# Patient Record
Sex: Male | Born: 1990 | Race: White | Hispanic: No | Marital: Married | Smoking: Former smoker
Health system: Southern US, Community
[De-identification: ages and names within clinical notes are randomized; demographics above are authoritative.]

## PROBLEM LIST (undated history)

## (undated) DIAGNOSIS — E785 Hyperlipidemia, unspecified: Secondary | ICD-10-CM

## (undated) DIAGNOSIS — K219 Gastro-esophageal reflux disease without esophagitis: Secondary | ICD-10-CM

## (undated) HISTORY — DX: Hyperlipidemia, unspecified: E78.5

## (undated) HISTORY — PX: OTHER SURGICAL HISTORY: SHX169

## (undated) HISTORY — DX: Gastro-esophageal reflux disease without esophagitis: K21.9

---
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
## 2008-07-14 IMAGING — CR RIGHT ANKLE - COMPLETE 3+ VIEW
1 series · 5 of 5 positions shown · non-contrast
Comparison: none

REASON FOR EXAM: swelling
COMMENTS:

[Series 1: view not recorded · 0.17mm/px · 5 of 5 slices shown]
[im 1/5]
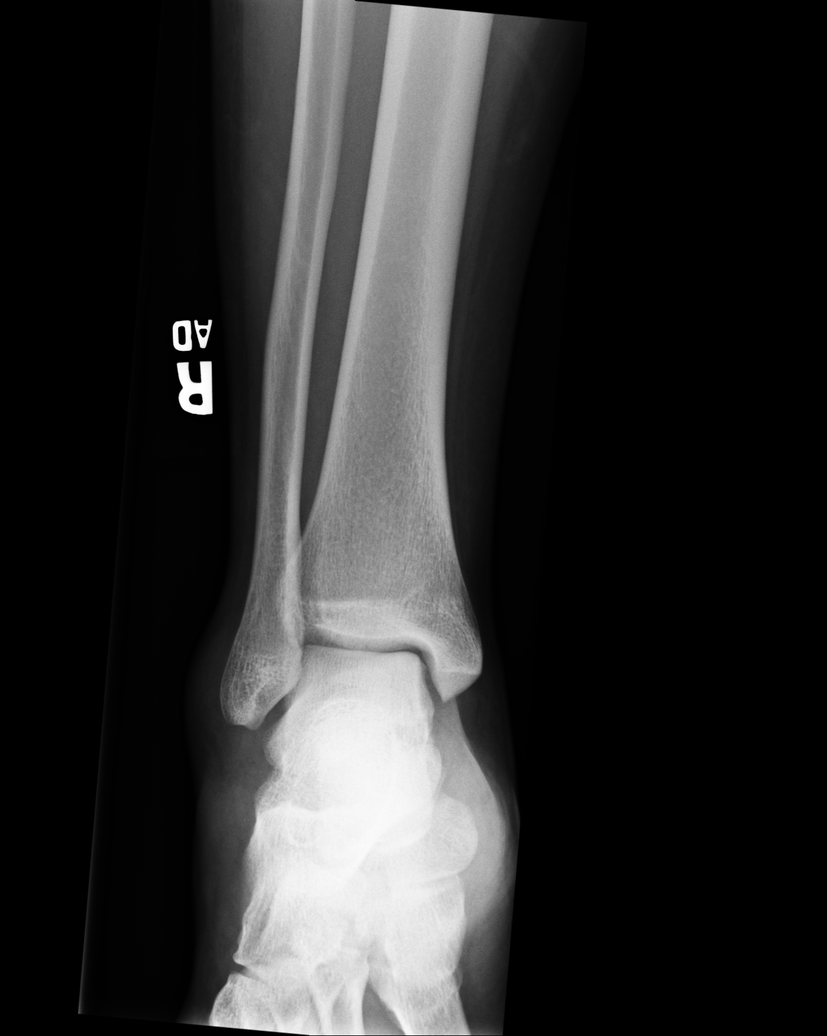
[im 2/5]
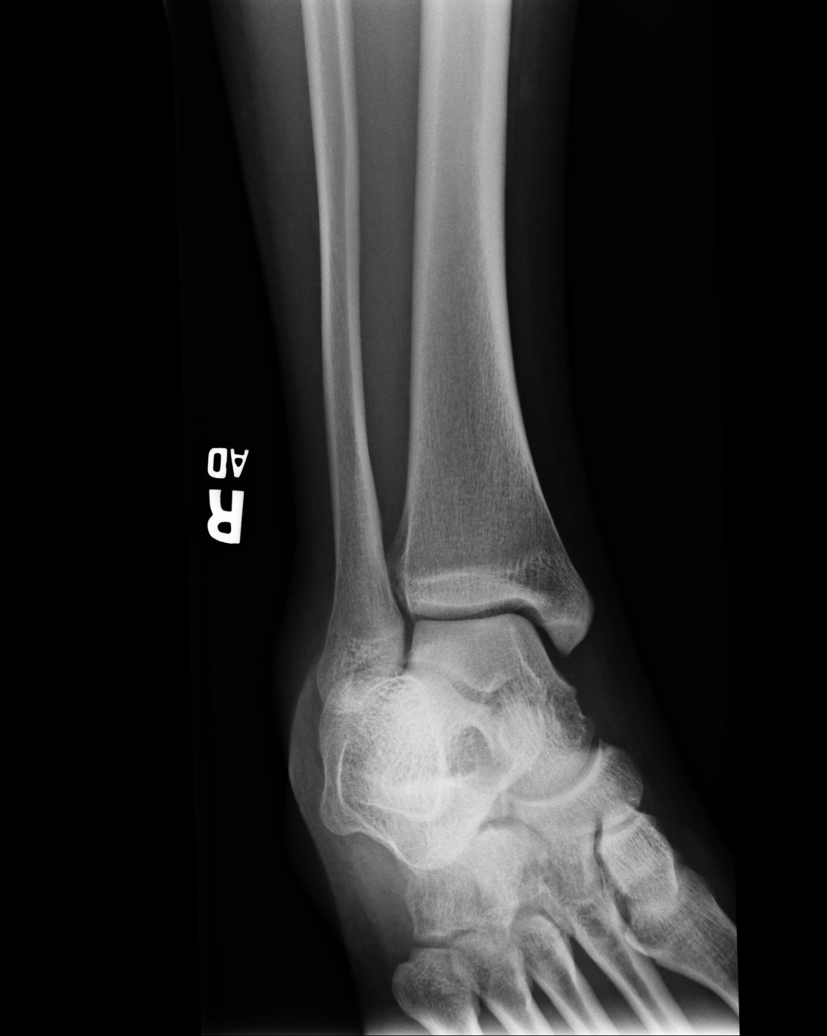
[im 3/5]
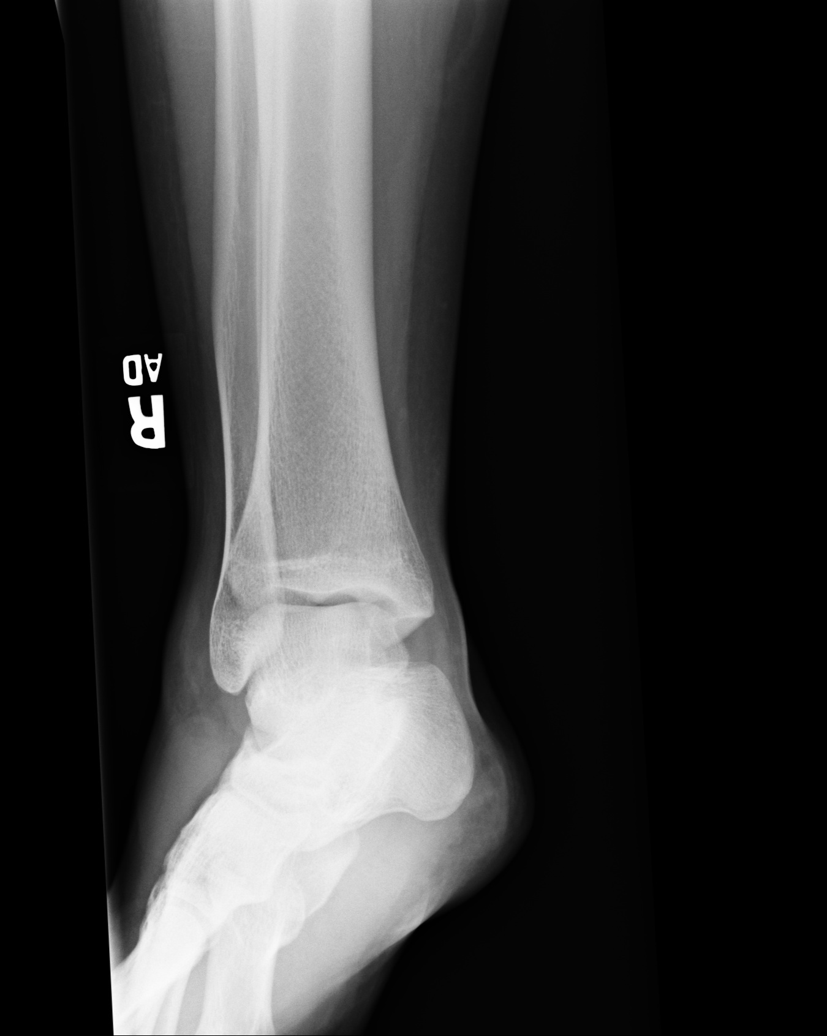
[im 4/5]
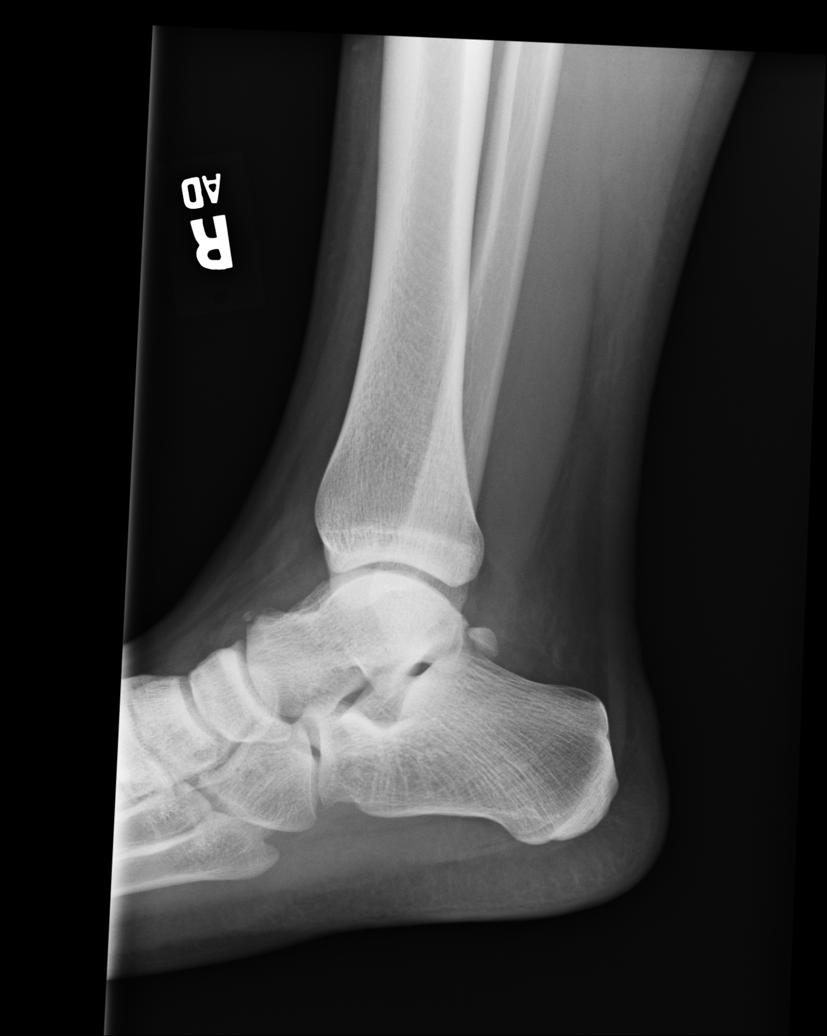
[im 5/5]
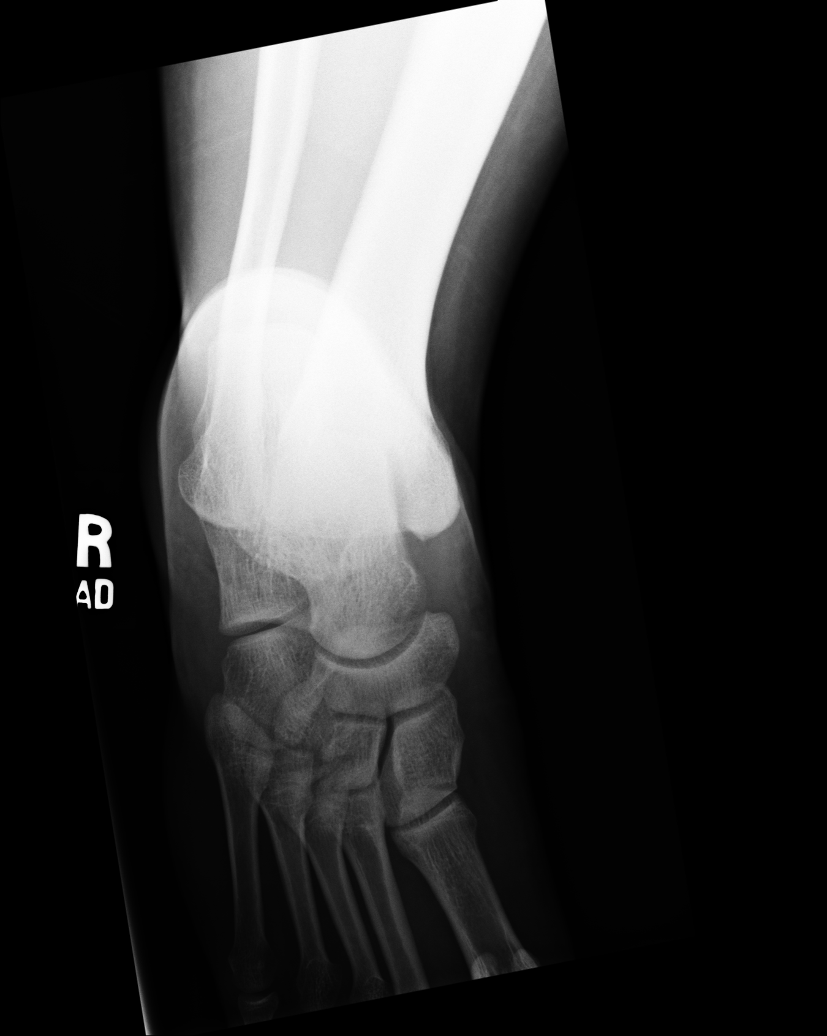

[5 of 5 positions shown; findings below may reference images not displayed]

PROCEDURE:     DXR - DXR ANKLE RIGHT COMPLETE  - October 27, 2006  [DATE]

RESULT:     There is soft tissue swelling over the lateral malleolus. The
ankle joint mortise is preserved. The talar dome is intact. The metatarsal
bases are grossly normal. There is a tiny bony density adjacent to the
distal aspect of the talus.
IMPRESSION: 1.There is soft tissue swelling over the lateral malleolus. I do not see
evidence of an acute ankle fracture. There may be a tiny avulsion from the
dorsal aspect of the distal talus. Correlation clinically here is needed.

## 2008-10-14 ENCOUNTER — Emergency Department (HOSPITAL_COMMUNITY): Admission: EM | Admit: 2008-10-14 | Discharge: 2008-10-14 | Payer: Self-pay | Admitting: Emergency Medicine

## 2010-10-20 NOTE — Op Note (Signed)
NAME:  Christian York, Christian York               ACCOUNT NO.:  [id number]   MEDICAL RECORD NO.:  [id number]          PATIENT TYPE:  AMB   LOCATION:  [facility]                         FACILITY:  [facility]   PHYSICIAN:  Jon Gills, M.D.  DATE OF BIRTH:  1990/09/21   DATE OF PROCEDURE:  07/13/2005  DATE OF DISCHARGE:  07/13/2005                                 OPERATIVE REPORT   PREOPERATIVE DIAGNOSIS:  Lower gastrointestinal bleeding.   POSTOPERATIVE DIAGNOSIS:  Lower gastrointestinal bleeding.   OPERATION:  Colonoscopy.   SURGEON:  Jon Gills, M.D.   ASSISTANT:  None.   DESCRIPTION OF FINDINGS:  Following informed written consent, the patient  was taken to the operating room and placed under general anesthesia with  continuous cardiopulmonary monitoring.  He remained in the supine position  and examination of the perineum revealed a solitary noninflamed hemorrhoid,  but no tags or fissures.  Digital examination of the rectum revealed an  empty rectal vault.  The Olympus PCF-100 colonoscope was passed per rectum  and advanced without difficulty.  The overall colon prep was poor, but I was  able to advance the colonoscope 110 cm to the hepatic flexure.  Normal  mucosa was seen throughout.  There was no evidence for polyps or vascular  abnormalities.  There was no ulceration, inflammation or granularity seen.  I could find no evidence for active or previous bleeding.  The colonoscope  was gradually withdrawn and the patient was awakened and taken to the  recovery room in satisfactory condition.  He will be released later today to  the care of his family.  I had no specific recommendations to make for  followup other than to treat his hemorrhoid if it should become symptomatic  again with over-the-counter therapy.   DESCRIPTION OF TECHNICAL PROCEDURES USED:  Olympus PCF colonoscope with cold  biopsy forceps.   DESCRIPTION OF SPECIMENS REMOVED:  None.            ______________________________  Jon Gills, M.D.     JHC/MEDQ  D:  08/20/2005  T:  08/21/2005  Job:  [id number]   cc:   Fonnie Mu, M.D.  Fax: [phone]

## 2017-07-10 ENCOUNTER — Ambulatory Visit: Payer: 59 | Admitting: Family Medicine

## 2017-07-10 ENCOUNTER — Encounter: Payer: Self-pay | Admitting: Family Medicine

## 2017-07-10 VITALS — BP 124/76 | HR 83 | Temp 98.4°F | Ht 74.5 in | Wt 346.2 lb

## 2017-07-10 DIAGNOSIS — Z6841 Body Mass Index (BMI) 40.0 and over, adult: Secondary | ICD-10-CM | POA: Diagnosis not present

## 2017-07-10 DIAGNOSIS — E785 Hyperlipidemia, unspecified: Secondary | ICD-10-CM

## 2017-07-10 DIAGNOSIS — Z7689 Persons encountering health services in other specified circumstances: Secondary | ICD-10-CM | POA: Diagnosis not present

## 2017-07-10 LAB — CBC
HCT: 44 % (ref 39.0–52.0)
Hemoglobin: 14.9 g/dL (ref 13.0–17.0)
MCHC: 33.9 g/dL (ref 30.0–36.0)
MCV: 90.5 fl (ref 78.0–100.0)
Platelets: 309 10*3/uL (ref 150.0–400.0)
RBC: 4.86 Mil/uL (ref 4.22–5.81)
RDW: 13.6 % (ref 11.5–15.5)
WBC: 6.9 10*3/uL (ref 4.0–10.5)

## 2017-07-10 LAB — HEMOGLOBIN A1C: HEMOGLOBIN A1C: 5.6 % (ref 4.6–6.5)

## 2017-07-10 NOTE — Progress Notes (Signed)
Subjective:    Patient ID: Christian York, male    DOB: 1990-08-07, 27 y.o.   MRN: [id number]  HPI This is a 27 yo male who presents today to establish care. He is married. He sells copiers.  Watches TV. Has dog- lab. No recent health care.    GERD- Takes omeprazole occasionally- symptoms with heavy foods, tomato based products. Has been watching diet, avoiding triggers, and has had fewer symptoms.   Obesity- Has been watching weight and has lost 16 pounds in several months. Walks dog daily. Some exercise walking and lifting with his job.   Hyperlipidemia- has had elevated cholesterol in past, has taken fish oil. Did not want to take statins.   Dentist- regular Eye exam- within last 2 years  Past Medical History:  Diagnosis Date  . GERD (gastroesophageal reflux disease)   . Hyperlipidemia    .History reviewed. No pertinent surgical history. Family History  Problem Relation Age of Onset  . Diabetes Mother   . Hyperlipidemia Mother   . Stroke Mother   . Diabetes Father   . Cancer Maternal Grandmother   . Cancer Maternal Grandfather   . Early death Maternal Grandfather   . COPD Paternal Grandmother    Social History   Tobacco Use  . Smoking status: Former Games developer  . Smokeless tobacco: Never Used  Substance Use Topics  . Alcohol use: Yes  . Drug use: No      Review of Systems  Constitutional: Negative for fatigue and unexpected weight change.  Respiratory: Negative for cough and shortness of breath.   Cardiovascular: Negative for chest pain, palpitations and leg swelling.  Gastrointestinal: Positive for abdominal pain (occasional reflux). Negative for constipation, diarrhea, nausea and vomiting.  Musculoskeletal: Negative for arthralgias, back pain, myalgias and neck pain.       Occasional left ankle pain. Has old fracture. Wears lace up brace.   Neurological: Negative for headaches.       Objective:   Physical Exam  Constitutional: He is oriented to person,  place, and time. He appears well-developed and well-nourished. No distress.  HENT:  Head: Normocephalic and atraumatic.  Eyes: Conjunctivae are normal.  Cardiovascular: Normal rate, regular rhythm and normal heart sounds.  Pulmonary/Chest: Effort normal and breath sounds normal.  Musculoskeletal: He exhibits no edema.  Neurological: He is alert and oriented to person, place, and time.  Skin: Skin is warm and dry. He is not diaphoretic.  Psychiatric: He has a normal mood and affect. His behavior is normal. Judgment and thought content normal.  Vitals reviewed.     BP 124/76   Pulse 83   Temp 98.4 F (36.9 C) (Oral)   Ht 6' 2.5" (1.892 m)   Wt (!) 346 lb 4 oz (157.1 kg)   SpO2 95%   BMI 43.86 kg/m  Ideal body weight: 83.4 kg (183 lb 12.1 oz) Adjusted ideal body weight: 112.8 kg (248 lb 12 oz)     Assessment & Plan:  1. Encounter to establish care - Discussed and encouraged healthy lifestyle choices- adequate sleep, regular exercise, stress management and healthy food choices.   2. Class 3 severe obesity due to excess calories without serious comorbidity with body mass index (BMI) of 40.0 to 44.9 in adult (HCC) - TSH - Hemoglobin A1c - encouraged his weight loss efforts and discussed reasonable goals  3. Hyperlipidemia, unspecified hyperlipidemia type - CBC - Comprehensive metabolic panel - TSH - Hemoglobin A1c   Olean Ree, FNP-BC  [place] [facility], [facility]  07/10/2017 5:07 PM

## 2017-07-10 NOTE — Patient Instructions (Signed)

## 2017-07-11 LAB — COMPREHENSIVE METABOLIC PANEL
ALBUMIN: 4.6 g/dL (ref 3.5–5.2)
ALK PHOS: 64 U/L (ref 39–117)
ALT: 40 U/L (ref 0–53)
AST: 24 U/L (ref 0–37)
BUN: 15 mg/dL (ref 6–23)
CO2: 29 mEq/L (ref 19–32)
Calcium: 9.6 mg/dL (ref 8.4–10.5)
Chloride: 103 mEq/L (ref 96–112)
Creatinine, Ser: 1.21 mg/dL (ref 0.40–1.50)
GFR: 75.68 mL/min (ref >60.00)
GLUCOSE: 87 mg/dL (ref 70–99)
POTASSIUM: 5.1 meq/L (ref 3.5–5.1)
Sodium: 139 mEq/L (ref 135–145)
TOTAL PROTEIN: 7.6 g/dL (ref 6.0–8.3)
Total Bilirubin: 0.5 mg/dL (ref 0.2–1.2)

## 2017-07-11 LAB — TSH: TSH: 1.92 u[IU]/mL (ref 0.35–4.50)

## 2017-11-29 ENCOUNTER — Ambulatory Visit: Payer: 59 | Admitting: Family Medicine

## 2017-11-29 ENCOUNTER — Encounter: Payer: Self-pay | Admitting: Family Medicine

## 2017-11-29 VITALS — BP 128/84 | HR 87 | Temp 98.0°F | Ht 74.5 in | Wt 349.8 lb

## 2017-11-29 DIAGNOSIS — N4889 Other specified disorders of penis: Secondary | ICD-10-CM

## 2017-11-29 DIAGNOSIS — K219 Gastro-esophageal reflux disease without esophagitis: Secondary | ICD-10-CM

## 2017-11-29 NOTE — Progress Notes (Signed)
   Subjective:    Patient ID: Christian York, male    DOB: 1990/10/02, 27 y.o.   MRN: [id number]  HPI This is a 27 yo male who presents today with penis pain x 2 days. Wife had recent vaginal yeast infection and he wonders if he has yeast. No new soaps, detergents, underwear, etc. Some increased intercourse over last couple of weeks. Poured alcohol on penis last night with significant pain. Feels better and less irritated today. No discharge, dysuria, difficulty urinating.   Has tried to stop omeprazole cold [place]. Concerned about long term side effects. Increased GERD symptoms.   [place]    Past Medical History:  Diagnosis Date  . GERD (gastroesophageal reflux disease)   . Hyperlipidemia    No past surgical history on file. Family History  Problem Relation Age of Onset  . Diabetes Mother   . Hyperlipidemia Mother   . Stroke Mother   . Diabetes Father   . Cancer Maternal Grandmother   . Cancer Maternal Grandfather   . Early death Maternal Grandfather   . COPD Paternal Grandmother    Social History   Tobacco Use  . Smoking status: Former Games developermoker  . Smokeless tobacco: Never Used  Substance Use Topics  . Alcohol use: Yes  . Drug use: No      Review of Systems Per HPI    Objective:   Physical Exam  Constitutional: He is oriented to person, place, and time. He appears well-developed and well-nourished.  HENT:  Head: Normocephalic and atraumatic.  Cardiovascular: Normal rate.  Pulmonary/Chest: Effort normal.  Genitourinary: Circumcised. Penile erythema (very mild around left side of glans. ) present. No phimosis, hypospadias or penile tenderness. No discharge found.  Neurological: He is alert and oriented to person, place, and time.  Skin: Skin is warm and dry.  Vitals reviewed.     BP 128/84 (BP Location: Right Arm, Patient Position: Sitting, Cuff Size: Large)   Pulse 87   Temp 98 F (36.7 C) (Oral)   Ht 6' 2.5" (1.892 m)   Wt  (!) 349 lb 12 oz (158.6 kg)   SpO2 97%   BMI 44.30 kg/m  Wt Readings from Last 3 Encounters:  11/29/17 (!) 349 lb 12 oz (158.6 kg)  07/10/17 (!) 346 lb 4 oz (157.1 kg)       Assessment & Plan:  1. Irritation of penis - improved today, no discharge - instructed to wash with mild soap and water, dry thoroughly - if increased redness or irritation can use OTC clotrimazole BID x 7 days - RTC precautions reviewed  2. Gastroesophageal reflux disease, esophagitis presence not specified - discussed weaning off omeprazole and provided instructions to minimize rebound effects   Olean Reeeborah Marcas Bowsher, FNP-BC  [place] [place] [facility], [facility]  11/29/2017 2:37 PM

## 2017-11-29 NOTE — Patient Instructions (Signed)
For weaning off omeprazole- take 1/2 tablet for at least 2 weeks, can take longer Your can also add either ranitidine or famotadine otc twice a day (generic Zantac or Pepcid)  Use mild soap and water daily, get area dry, if becomes more irritated or red can use Clotrimazole over the counter twice a day for 7 days. Just apply small amount.

## 2019-03-01 DIAGNOSIS — A084 Viral intestinal infection, unspecified: Secondary | ICD-10-CM | POA: Diagnosis not present

## 2019-03-01 DIAGNOSIS — J029 Acute pharyngitis, unspecified: Secondary | ICD-10-CM | POA: Diagnosis not present

## 2019-03-21 DIAGNOSIS — Z23 Encounter for immunization: Secondary | ICD-10-CM | POA: Diagnosis not present

## 2019-05-05 ENCOUNTER — Other Ambulatory Visit: Payer: Self-pay

## 2019-05-05 DIAGNOSIS — Z20822 Contact with and (suspected) exposure to covid-19: Secondary | ICD-10-CM

## 2019-05-07 LAB — NOVEL CORONAVIRUS, NAA: SARS-CoV-2, NAA: NOT DETECTED

## 2019-07-20 ENCOUNTER — Other Ambulatory Visit: Payer: Self-pay | Admitting: Family Medicine

## 2019-07-23 ENCOUNTER — Other Ambulatory Visit: Payer: 59

## 2019-07-27 ENCOUNTER — Encounter: Payer: Self-pay | Admitting: Family Medicine

## 2019-07-27 ENCOUNTER — Other Ambulatory Visit: Payer: Self-pay

## 2019-07-27 ENCOUNTER — Ambulatory Visit (INDEPENDENT_AMBULATORY_CARE_PROVIDER_SITE_OTHER): Payer: BC Managed Care – PPO | Admitting: Family Medicine

## 2019-07-27 VITALS — BP 128/84 | HR 86 | Temp 97.9°F | Ht 75.0 in | Wt 344.1 lb

## 2019-07-27 DIAGNOSIS — K219 Gastro-esophageal reflux disease without esophagitis: Secondary | ICD-10-CM

## 2019-07-27 DIAGNOSIS — Z0000 Encounter for general adult medical examination without abnormal findings: Secondary | ICD-10-CM

## 2019-07-27 LAB — HEMOGLOBIN A1C: Hgb A1c MFr Bld: 5.9 % (ref 4.6–6.5)

## 2019-07-27 MED ORDER — OMEPRAZOLE 20 MG PO CPDR: 20.0000 mg | DELAYED_RELEASE_CAPSULE | Freq: Every day | ORAL | 1 refills | Status: DC

## 2019-07-27 NOTE — Progress Notes (Signed)
Subjective:    Patient ID: Christian York, male    DOB: 1990-11-01, 29 y.o.   MRN: [id number]  HPI This is a 29 year old male who presents today for CPE. Had a baby boy March 2020. He and wife both had to get new jobs. He is managing a Production assistant, radio.    Last CPE-had labs 2 years ago Tdap-07/08/2013 Flu-03/21/2019 Dental- regular Exercise-not outside of work  Jerrye Bushy- takes omeprazole most days. Triggered by not eating, eating tomatoes, sweets, fried foods.   Obesity- eats out 2-3 meals a day.  Would like to lose weight.  His wife does not cook.  He does not mind cooking.  He likes a variety of foods.  Struggles with planning and preparation due to time constraints.  Wife reports some snoring, no apneic episodes.  He is sleeping 7 to 8 hours a night and feeling rested.  Does not fall asleep while driving.  Review of Systems  Constitutional: Negative.   HENT: Negative.   Eyes: Negative.   Respiratory: Negative.   Cardiovascular: Negative.   Gastrointestinal: Positive for abdominal pain (GERD per HPI). Negative for blood in stool, constipation and diarrhea.  Endocrine: Negative.   Genitourinary: Negative.   Musculoskeletal: Negative.   Skin: Negative.   Allergic/Immunologic: Negative.   Neurological: Negative.   Hematological: Negative.   Psychiatric/Behavioral: Negative.        Objective:   Physical Exam Vitals reviewed.  Constitutional:      General: He is not in acute distress.    Appearance: Normal appearance. He is obese. He is not ill-appearing, toxic-appearing or diaphoretic.  HENT:     Head: Normocephalic and atraumatic.  Eyes:     Conjunctiva/sclera: Conjunctivae normal.  Cardiovascular:     Rate and Rhythm: Normal rate and regular rhythm.     Heart sounds: Normal heart sounds.  Pulmonary:     Effort: Pulmonary effort is normal.     Breath sounds: Normal breath sounds.  Abdominal:     General: Abdomen is flat. Bowel sounds are normal. There is no distension.       Palpations: Abdomen is soft. There is no mass.     Tenderness: There is no abdominal tenderness. There is no guarding or rebound.     Hernia: No hernia is present.  Musculoskeletal:     Cervical back: Normal range of motion.     Right lower leg: No edema.     Left lower leg: No edema.  Skin:    General: Skin is warm and dry.  Neurological:     Mental Status: He is alert and oriented to person, place, and time.  Psychiatric:        Mood and Affect: Mood normal.        Behavior: Behavior normal.        Thought Content: Thought content normal.        Judgment: Judgment normal.       BP 128/84 (BP Location: Left Arm, Patient Position: Sitting, Cuff Size: Large)   Pulse 86   Temp 97.9 F (36.6 C) (Temporal)   Ht 6\' 3"  (1.905 m)   Wt (!) 344 lb 1.9 oz (156.1 kg)   SpO2 97%   BMI 43.01 kg/m  Wt Readings from Last 3 Encounters:  07/27/19 (!) 344 lb 1.9 oz (156.1 kg)  11/29/17 (!) 349 lb 12 oz (158.6 kg)  07/10/17 (!) 346 lb 4 oz (157.1 kg)   Depression screen [facility] 2/9 07/27/2019  Decreased Interest 0  Down, Depressed, Hopeless 0  PHQ - 2 Score 0       Assessment & Plan:  1. Annual physical exam - Discussed and encouraged healthy lifestyle choices- adequate sleep, regular exercise, stress management and healthy food choices.    2. Morbid obesity (HCC) - provided written information regarding healthy, balanced meals, online resources - Hemoglobin A1c - Lipid panel  3. Gastroesophageal reflux disease, unspecified whether esophagitis present - continue omeprazole 20 mg prn, discussed avoiding triggers, importance of weight loss   Olean Ree, FNP-BC  [place] [facility] at [facility], [facility]  07/27/2019 9:32 AM

## 2019-07-27 NOTE — Patient Instructions (Signed)
A resource that I like is www.dietdoctor.com/diabetes/diet  Youtube- Wylene Simmer, Dr. Allyson Sabal  Here are some guidelines to help you with meal planning -  Avoid all processed and packaged foods (bread, pasta, crackers, chips, etc) and beverages containing calories.  Avoid added sugars and excessive natural sugars.  Attention to how you feel if you consume artificial sweeteners.  Do they make you more hungry or raise your blood sugar?  With every meal and snack, aim to get 20 g of protein (3 ounces of meat, 4 ounces of fish, 3 eggs, protein powder, 1 cup [place] yogurt, 1 cup cottage cheese, etc.)  Increase fiber in the form of non-starchy vegetables.  These help you feel full with very little carbohydrates and are good for gut health.  Eat 1 serving healthy carb per meal- 1/2 cup brown rice, beans, potato, corn- pay attention to whether or not this significantly raises your blood sugar. If it does, reduce the frequency you consume these.   Eat 2-3 servings of lower sugar fruits daily.  This includes berries, apples, oranges, peaches, pears, one half banana.  Have small amounts of good fats such as avocado, nuts, olive oil, nut butters, olives.  Add a little cheese to your salads to make them tasty.

## 2019-07-28 LAB — LDL CHOLESTEROL, DIRECT: Direct LDL: 154 mg/dL

## 2019-07-28 LAB — LIPID PANEL
Cholesterol: 229 mg/dL — ABNORMAL HIGH (ref 0–200)
HDL: 36.4 mg/dL — ABNORMAL LOW (ref >39.00)
NonHDL: 192.37
Total CHOL/HDL Ratio: 6
Triglycerides: 247 mg/dL — ABNORMAL HIGH (ref 0.0–149.0)
VLDL: 49.4 mg/dL — ABNORMAL HIGH (ref 0.0–40.0)

## 2019-09-04 ENCOUNTER — Ambulatory Visit: Payer: BC Managed Care – PPO | Attending: Internal Medicine

## 2019-09-04 DIAGNOSIS — Z23 Encounter for immunization: Secondary | ICD-10-CM

## 2019-09-04 NOTE — Progress Notes (Signed)
   Covid-19 Vaccination Clinic  Name:  Christian York    MRN: [id number] DOB: 10-30-1990  09/04/2019  [name] was observed post Covid-19 immunization for 15 minutes without incident. He was provided with Vaccine Information Sheet and instruction to access the V-Safe system.   [name] was instructed to call 911 with any severe reactions post vaccine: Marland Kitchen Difficulty breathing  . Swelling of face and throat  . A fast heartbeat  . A bad rash all over body  . Dizziness and weakness   Immunizations Administered    Name Date Dose VIS Date Route   Pfizer COVID-19 Vaccine 09/04/2019  2:28 PM 0.3 mL 05/15/2019 Intramuscular   Manufacturer: [employer], [employer]   Lot: [id number]   NDC: [address]-4

## 2019-09-30 ENCOUNTER — Ambulatory Visit: Payer: BC Managed Care – PPO | Attending: Internal Medicine

## 2019-09-30 DIAGNOSIS — Z23 Encounter for immunization: Secondary | ICD-10-CM

## 2019-09-30 NOTE — Progress Notes (Signed)
   Covid-19 Vaccination Clinic  Name:  Christian York    MRN: [id number] DOB: 10-22-1990  09/30/2019  Mr. [name] was observed post Covid-19 immunization for 15 minutes without incident. He was provided with Vaccine Information Sheet and instruction to access the V-Safe system.   Mr. [name] was instructed to call 911 with any severe reactions post vaccine: Marland Kitchen Difficulty breathing  . Swelling of face and throat  . A fast heartbeat  . A bad rash all over body  . Dizziness and weakness   Immunizations Administered    Name Date Dose VIS Date Route   Pfizer COVID-19 Vaccine 09/30/2019  3:01 PM 0.3 mL 07/29/2018 Intramuscular   Manufacturer: [employer], [employer]   Lot: [id number]   NDC: [address]-5

## 2020-01-30 ENCOUNTER — Other Ambulatory Visit: Payer: Self-pay | Admitting: Family Medicine

## 2020-01-30 DIAGNOSIS — K219 Gastro-esophageal reflux disease without esophagitis: Secondary | ICD-10-CM

## 2020-05-09 ENCOUNTER — Encounter: Payer: Self-pay | Admitting: Family Medicine

## 2020-05-09 ENCOUNTER — Ambulatory Visit (INDEPENDENT_AMBULATORY_CARE_PROVIDER_SITE_OTHER): Payer: BC Managed Care – PPO | Admitting: Family Medicine

## 2020-05-09 ENCOUNTER — Other Ambulatory Visit: Payer: Self-pay

## 2020-05-09 VITALS — BP 124/80 | HR 78 | Temp 97.5°F | Wt 337.0 lb

## 2020-05-09 DIAGNOSIS — L600 Ingrowing nail: Secondary | ICD-10-CM | POA: Diagnosis not present

## 2020-05-09 NOTE — Patient Instructions (Signed)
Keep clean and dry, Apply bacitracin daily, keep covered while draining.   Ingrown Toenail An ingrown toenail occurs when the corner or sides of a toenail grow into the surrounding skin. This causes discomfort and pain. The big toe is most commonly affected, but any of the toes can be affected. If an ingrown toenail is not treated, it can become infected. What are the causes? This condition may be caused by:  Wearing shoes that are too small or tight.  An injury, such as stubbing your toe or having your toe stepped on.  Improper cutting or care of your toenails.  Having nail or foot abnormalities that were present from birth (congenital abnormalities), such as having a nail that is too big for your toe. What increases the risk? The following factors may make you more likely to develop ingrown toenails:  Age. Nails tend to get thicker with age, so ingrown nails are more common among older people.  Cutting your toenails incorrectly, such as cutting them very short or cutting them unevenly. An ingrown toenail is more likely to get infected if you have:  Diabetes.  Blood flow (circulation) problems. What are the signs or symptoms? Symptoms of an ingrown toenail may include:  Pain, soreness, or tenderness.  Redness.  Swelling.  Hardening of the skin that surrounds the toenail. Signs that an ingrown toenail may be infected include:  Fluid or pus.  Symptoms that get worse instead of better. How is this diagnosed? An ingrown toenail may be diagnosed based on your medical history, your symptoms, and a physical exam. If you have fluid or blood coming from your toenail, a [place] may be collected to test for the specific type of bacteria that is causing the infection. How is this treated? Treatment depends on how severe your ingrown toenail is. You may be able to care for your toenail at home.  If you have an infection, you may be prescribed antibiotic medicines.  If you have fluid  or pus draining from your toenail, your health care provider may drain it.  If you have trouble walking, you may be given crutches to use.  If you have a severe or infected ingrown toenail, you may need a procedure to remove part or all of the nail. Follow these instructions at home: Foot care   Do not pick at your toenail or try to remove it yourself.  Soak your foot in warm, soapy water. Do this for 20 minutes, 3 times a day, or as often as told by your health care provider. This helps to keep your toe clean and keep your skin soft.  Wear shoes that fit well and are not too tight. Your health care provider may recommend that you wear open-toed shoes while you heal.  Trim your toenails regularly and carefully. Cut your toenails straight across to prevent injury to the skin at the corners of the toenail. Do not cut your nails in a curved shape.  Keep your feet clean and dry to help prevent infection. Medicines  Take over-the-counter and prescription medicines only as told by your health care provider.  If you were prescribed an antibiotic, take it as told by your health care provider. Do not stop taking the antibiotic even if you start to feel better. Activity  Return to your normal activities as told by your health care provider. Ask your health care provider what activities are safe for you.  Avoid activities that cause pain. General instructions  If your health care  provider told you to use crutches to help you move around, use them as instructed.  Keep all follow-up visits as told by your health care provider. This is important. Contact a health care provider if:  You have more redness, swelling, pain, or other symptoms that do not improve with treatment.  You have fluid, blood, or pus coming from your toenail. Get help right away if:  You have a red streak on your skin that starts at your foot and spreads up your leg.  You have a fever. Summary  An ingrown toenail  occurs when the corner or sides of a toenail grow into the surrounding skin. This causes discomfort and pain. The big toe is most commonly affected, but any of the toes can be affected.  If an ingrown toenail is not treated, it can become infected.  Fluid or pus draining from your toenail is a sign of infection. Your health care provider may need to drain it. You may be given antibiotics to treat the infection.  Trimming your toenails regularly and properly can help you prevent an ingrown toenail. This information is not intended to replace advice given to you by your health care provider. Make sure you discuss any questions you have with your health care provider. Document Revised: 09/12/2018 Document Reviewed: 02/06/2017 Elsevier Patient Education  [place].

## 2020-05-09 NOTE — Progress Notes (Signed)
   Subjective:    Patient ID: Christian York, male    DOB: 1990-09-02, 29 y.o.   MRN: [id number]  HPI Chief Complaint  Patient presents with  . Nail Problem    big toe on right foot....intermittent x 1 year..   This is a 29 year old male who presents today with above chief complaint. He  has a past medical history of GERD (gastroesophageal reflux disease) and Hyperlipidemia. He reports that he has been doing well, busy with work.  He has noticed off-and-on problems with his right great toenail for at least 1 year.  Has intermittent pain and redness on lateral aspect.  He denies any drainage.  Typically wears running shoes, occasionally crocs.   Review of Systems Per HPI    Objective:   Physical Exam Vitals reviewed.  Constitutional:      General: He is not in acute distress.    Appearance: Normal appearance. He is obese. He is not ill-appearing, toxic-appearing or diaphoretic.  Cardiovascular:     Rate and Rhythm: Normal rate.  Pulmonary:     Effort: Pulmonary effort is normal.  Skin:    General: Skin is warm and dry.     Comments: Right great toenail, lateral aspect with short nail, area of swelling, fluctuant, minimal erythema.  Area cleaned with alcohol swab and #15 scalpel used to make very small incision incision into swollen area, no resulting drainage.  Bandage applied.  Neurological:     Mental Status: He is alert and oriented to person, place, and time.  Psychiatric:        Mood and Affect: Mood normal.        Behavior: Behavior normal.        Thought Content: Thought content normal.        Judgment: Judgment normal.       BP 124/80   Pulse 78   Temp (!) 97.5 F (36.4 C) (Temporal)   Wt (!) 337 lb (152.9 kg)   SpO2 99%   BMI 42.12 kg/m  Wt Readings from Last 3 Encounters:  05/09/20 (!) 337 lb (152.9 kg)  07/27/19 (!) 344 lb 1.9 oz (156.1 kg)  11/29/17 (!) 349 lb 12 oz (158.6 kg)       Assessment & Plan:  1. Ingrown toenail without infection -  Provided written and verbal information regarding diagnosis and treatment. -No purulent drainage today, minimal erythema, discussed nail care, wearing shoes with adequate space, follow-up precautions.   Olean Ree, FNP-BC  [facility] [facility] at [facility], [facility]  05/09/2020 8:40 AM

## 2020-09-20 ENCOUNTER — Other Ambulatory Visit: Payer: Self-pay

## 2020-09-20 ENCOUNTER — Ambulatory Visit (INDEPENDENT_AMBULATORY_CARE_PROVIDER_SITE_OTHER): Payer: BC Managed Care – PPO | Admitting: Family Medicine

## 2020-09-20 ENCOUNTER — Encounter: Payer: Self-pay | Admitting: Family Medicine

## 2020-09-20 VITALS — BP 128/78 | HR 79 | Temp 97.1°F | Ht 75.0 in | Wt 350.2 lb

## 2020-09-20 DIAGNOSIS — Z114 Encounter for screening for human immunodeficiency virus [HIV]: Secondary | ICD-10-CM | POA: Diagnosis not present

## 2020-09-20 DIAGNOSIS — R7303 Prediabetes: Secondary | ICD-10-CM | POA: Diagnosis not present

## 2020-09-20 DIAGNOSIS — Z0000 Encounter for general adult medical examination without abnormal findings: Secondary | ICD-10-CM

## 2020-09-20 DIAGNOSIS — Z23 Encounter for immunization: Secondary | ICD-10-CM | POA: Diagnosis not present

## 2020-09-20 DIAGNOSIS — E782 Mixed hyperlipidemia: Secondary | ICD-10-CM | POA: Diagnosis not present

## 2020-09-20 DIAGNOSIS — K219 Gastro-esophageal reflux disease without esophagitis: Secondary | ICD-10-CM

## 2020-09-20 DIAGNOSIS — E785 Hyperlipidemia, unspecified: Secondary | ICD-10-CM | POA: Insufficient documentation

## 2020-09-20 DIAGNOSIS — Z1159 Encounter for screening for other viral diseases: Secondary | ICD-10-CM

## 2020-09-20 LAB — LIPID PANEL
Cholesterol: 224 mg/dL — ABNORMAL HIGH (ref 0–200)
HDL: 34.2 mg/dL — ABNORMAL LOW (ref >39.00)
NonHDL: 189.38
Total CHOL/HDL Ratio: 7
Triglycerides: 248 mg/dL — ABNORMAL HIGH (ref 0.0–149.0)
VLDL: 49.6 mg/dL — ABNORMAL HIGH (ref 0.0–40.0)

## 2020-09-20 LAB — LDL CHOLESTEROL, DIRECT: Direct LDL: 163 mg/dL

## 2020-09-20 LAB — HEMOGLOBIN A1C: Hgb A1c MFr Bld: 5.9 % (ref 4.6–6.5)

## 2020-09-20 NOTE — Assessment & Plan Note (Signed)
Pt will consider metformin to help with prediabetes and weight loss.

## 2020-09-20 NOTE — Patient Instructions (Signed)
Can consider starting metformin for prediabetes and may also help with weight loss  Would encourage you to try healthy eating and consider a lower calorie diet to help with weight loss.

## 2020-09-20 NOTE — Progress Notes (Signed)
Annual Exam   Chief Complaint:  Chief Complaint  Patient presents with  . Annual Exam    No concerns   . Transitions Of Care    History of Present Illness:  Christian York is a 30 y.o. presents today for annual examination.     Nutrition/Lifestyle Exercise: not currently, active job with walking and moderate lifting Diet: tries to eat healthy, more eating out lately He is single partner, contraception - currently pregnant.  Any issues getting or maintaining an erection? no  Social History   Tobacco Use  Smoking Status Former Smoker  . Packs/day: 0.75  . Years: 5.00  . Pack years: 3.75  . Types: Cigarettes  . Quit date: 06/05/2015  . Years since quitting: 5.2  Smokeless Tobacco Never Used   Social History   Substance and Sexual Activity  Alcohol Use Yes   Comment: once a month   Social History   Substance and Sexual Activity  Drug Use No     Safety The patient wears seatbelts: yes.     The patient feels safe at home and in their relationships: yes.  General Health Dentist in the last year: Yes Eye doctor: not applicable  Weight Wt Readings from Last 3 Encounters:  09/20/20 (!) 350 lb 4 oz (158.9 kg)  05/09/20 (!) 337 lb (152.9 kg)  07/27/19 (!) 344 lb 1.9 oz (156.1 kg)   Patient has high BMI  BMI Readings from Last 1 Encounters:  09/20/20 43.78 kg/m     Chronic disease screening Blood pressure monitoring:  BP Readings from Last 3 Encounters:  09/20/20 128/78  05/09/20 124/80  07/27/19 128/84    Lipid Monitoring: Indication for screening: age >35, obesity, diabetes, family hx, CV risk factors.  Lipid screening: Yes  Lab Results  Component Value Date   CHOL 229 (H) 07/27/2019   HDL 36.40 (L) 07/27/2019   LDLDIRECT 154.0 07/27/2019   TRIG 247.0 (H) 07/27/2019   CHOLHDL 6 07/27/2019     Diabetes Screening: age >16, overweight, family hx, PCOS, hx of gestational diabetes, at risk ethnicity, elevated blood pressure >135/80.  Diabetes  Screening screening: Yes  Lab Results  Component Value Date   HGBA1C 5.9 07/27/2019     Immunization History  Administered Date(s) Administered  . Influenza,inj,Quad PF,6+ Mos 03/06/2017, 03/13/2018, 03/21/2019  . Influenza-Unspecified 05/13/2017  . PFIZER(Purple Top)SARS-COV-2 Vaccination 09/04/2019, 09/30/2019  . Tdap 09/20/2020    Past Medical History:  Diagnosis Date  . GERD (gastroesophageal reflux disease)   . Hyperlipidemia     History reviewed. No pertinent surgical history.  Prior to Admission medications   Medication Sig Start Date End Date Taking? Authorizing Provider  omeprazole (PRILOSEC) 20 MG capsule TAKE 1 CAPSULE BY MOUTH EVERY DAY Patient taking differently: Take 20 mg by mouth as needed. 02/02/20  Yes Emi Belfast, FNP    Allergies  Allergen Reactions  . Amoxicillin Rash     Social History   Socioeconomic History  . Marital status: Married    Spouse name: Fredric Mare  . Number of children: 1  . Years of education: college  . Highest education level: Not on file  Occupational History  . Not on file  Tobacco Use  . Smoking status: Former Smoker    Packs/day: 0.75    Years: 5.00    Pack years: 3.75    Types: Cigarettes    Quit date: 06/05/2015    Years since quitting: 5.2  . Smokeless tobacco: Never Used  Vaping Use  .  Vaping Use: Former  Substance and Sexual Activity  . Alcohol use: Yes    Comment: once a month  . Drug use: No  . Sexual activity: Yes    Comment: wife, pregnant  Other Topics Concern  . Not on file  Social History Narrative   Patient is married and has 1 son (dob 08/2018).  He manages a Metallurgist.      09/20/20   From: the area   Living: with wife, Fredric Mare (2017) and children   Work: Production designer, theatre/television/film for a Metallurgist      Family: Jonah (2020) and one on the way      Enjoys: fish, play with jonah, watch sports      Exercise: not currently, active job with walking and moderate lifting   Diet: tries to eat healthy, more  eating out lately      Safety   Seat belts: Yes    Guns: Yes  and secure   Safe in relationships: Yes    Social Determinants of Health   Financial Resource Strain: Not on file  Food Insecurity: Not on file  Transportation Needs: Not on file  Physical Activity: Not on file  Stress: Not on file  Social Connections: Not on file  Intimate Partner Violence: Not on file    Family History  Problem Relation Age of Onset  . Diabetes Mother   . Hyperlipidemia Mother   . Stroke Mother 70  . Heart attack Mother 50  . Diabetes Father   . Breast cancer Maternal Grandmother   . Pancreatic cancer Maternal Grandfather   . COPD Paternal Grandmother     Review of Systems  Constitutional: Negative for chills and fever.  HENT: Negative for congestion and sore throat.   Eyes: Negative for blurred vision and double vision.  Respiratory: Negative for shortness of breath.   Cardiovascular: Negative for chest pain.  Gastrointestinal: Negative for heartburn, nausea and vomiting.  Genitourinary: Negative.   Musculoskeletal: Negative.  Negative for myalgias.  Skin: Negative for rash.  Neurological: Negative for dizziness and headaches.  Endo/Heme/Allergies: Does not bruise/bleed easily.  Psychiatric/Behavioral: Negative for depression. The patient is not nervous/anxious.      Physical Exam BP 128/78   Pulse 79   Temp (!) 97.1 F (36.2 C) (Temporal)   Ht 6\' 3"  (1.905 m)   Wt (!) 350 lb 4 oz (158.9 kg)   SpO2 97%   BMI 43.78 kg/m    BP Readings from Last 3 Encounters:  09/20/20 128/78  05/09/20 124/80  07/27/19 128/84      Physical Exam Constitutional:      General: He is not in acute distress.    Appearance: He is well-developed. He is obese. He is not diaphoretic.  HENT:     Head: Normocephalic and atraumatic.     Right Ear: Tympanic membrane and ear canal normal.     Left Ear: Tympanic membrane and ear canal normal.     Nose: Nose normal.     Mouth/Throat:     Pharynx:  Uvula midline.  Eyes:     General: No scleral icterus.    Conjunctiva/sclera: Conjunctivae normal.     Pupils: Pupils are equal, round, and reactive to light.  Cardiovascular:     Rate and Rhythm: Normal rate and regular rhythm.     Heart sounds: Normal heart sounds. No murmur heard.   Pulmonary:     Effort: Pulmonary effort is normal. No respiratory distress.     Breath  sounds: Normal breath sounds. No wheezing.  Abdominal:     General: Bowel sounds are normal. There is no distension.     Palpations: Abdomen is soft. There is no mass.     Tenderness: There is no abdominal tenderness. There is no guarding.  Musculoskeletal:        General: Normal range of motion.     Cervical back: Normal range of motion and neck supple.  Lymphadenopathy:     Cervical: No cervical adenopathy.  Skin:    General: Skin is warm and dry.     Capillary Refill: Capillary refill takes less than 2 seconds.  Neurological:     Mental Status: He is alert and oriented to person, place, and time.        Results:  PHQ-9:   Depression screen [facility] 2/9 09/20/2020 07/27/2019  Decreased Interest 0 0  Down, Depressed, Hopeless 0 0  PHQ - 2 Score 0 0       Assessment: 30 y.o. here for routine annual physical examination.  Plan: Problem List Items Addressed This Visit      Digestive   Acid reflux    Avoiding trigger foods, taking something every 2-3 days.         Other   Morbid obesity (HCC)   Prediabetes    Pt will consider metformin to help with prediabetes and weight loss.       Relevant Orders   Hemoglobin A1c   Hyperlipidemia   Relevant Orders   Lipid panel    Other Visit Diagnoses    Annual physical exam    -  Primary   Need for hepatitis C screening test       Relevant Orders   Hepatitis C antibody   Encounter for screening for HIV       Relevant Orders   HIV Antibody (routine testing w rflx)   Need for tetanus, diphtheria, and acellular pertussis (Tdap) vaccine in patient of  adolescent age or older       Relevant Orders   Administer Tetanus-diphtheria-acellular pertussis (Tdap) vaccine (Completed)      Screening: -- Blood pressure screen normal -- cholesterol screening: will obtain -- Weight screening: obese: discussed management options, including lifestyle, dietary, and exercise. -- Diabetes Screening: will obtain -- Nutrition: Encouraged healthy diet and exercise  The ASCVD Risk score Denman George DC Jr., et al., 2013) failed to calculate for the following reasons:   The 2013 ASCVD risk score is only valid for ages 52 to 50  -- Statin therapy for Age 50-75 with CVD risk >7.5%  Psych -- Depression screening (PHQ-9): negative   Safety -- tobacco screening: not using -- alcohol screening:  low-risk usage. -- no evidence of domestic violence or intimate partner violence.   Cancer Screening -- No age related cancer screening due  Immunizations Immunization History  Administered Date(s) Administered  . Influenza,inj,Quad PF,6+ Mos 03/06/2017, 03/13/2018, 03/21/2019  . Influenza-Unspecified 05/13/2017  . PFIZER(Purple Top)SARS-COV-2 Vaccination 09/04/2019, 09/30/2019  . Tdap 09/20/2020    -- flu vaccine out of season -- TDAP q10 years not up to date - will give today -- Covid-19 Vaccine - did not get booster   Encouraged regular vision and dental screening. Encouraged healthy exercise and diet.   Lynnda Child

## 2020-09-20 NOTE — Assessment & Plan Note (Signed)
Avoiding trigger foods, taking something every 2-3 days.

## 2020-09-21 LAB — HEPATITIS C ANTIBODY
Hepatitis C Ab: NONREACTIVE
SIGNAL TO CUT-OFF: 0.02

## 2020-09-21 LAB — HIV ANTIBODY (ROUTINE TESTING W REFLEX): HIV 1&2 Ab, 4th Generation: NONREACTIVE

## 2020-12-19 ENCOUNTER — Other Ambulatory Visit: Payer: Self-pay

## 2020-12-19 ENCOUNTER — Ambulatory Visit (INDEPENDENT_AMBULATORY_CARE_PROVIDER_SITE_OTHER): Payer: BC Managed Care – PPO | Admitting: Family Medicine

## 2020-12-19 ENCOUNTER — Encounter: Payer: Self-pay | Admitting: Family Medicine

## 2020-12-19 DIAGNOSIS — L237 Allergic contact dermatitis due to plants, except food: Secondary | ICD-10-CM | POA: Diagnosis not present

## 2020-12-19 MED ORDER — PREDNISONE 10 MG PO TABS: ORAL_TABLET | ORAL | 0 refills | Status: DC

## 2020-12-19 NOTE — Patient Instructions (Signed)
Prednisone with food.  Claritin 10mg  a day, not claritin D.   Take care.  Glad to see you. Update me as needed.

## 2020-12-19 NOTE — Progress Notes (Signed)
This visit occurred during the SARS-CoV-2 public health emergency.  Safety protocols were in place, including screening questions prior to the visit, additional usage of staff PPE, and extensive cleaning of exam room while observing appropriate contact time as indicated for disinfecting solutions.  Rash.  Poison ivy exposure.  Started 2 weeks ago, R forearm. Itching.  Then R side of abdomen, then L forearm, then L abd.  Still itching.  No FCANVD.  No wheeze.    Meds, vitals, and allergies reviewed.   ROS: Per HPI unless specifically indicated in ROS section   Nad Ncat Neck supple, no LA Rrr Ctab Skin with irregular distribution of nondermatomal lesions in various stages of healing without ulceration on the abdomen and bilateral forearms.

## 2020-12-21 DIAGNOSIS — L237 Allergic contact dermatitis due to plants, except food: Secondary | ICD-10-CM | POA: Insufficient documentation

## 2020-12-21 NOTE — Assessment & Plan Note (Signed)
Start prednisone.  Prednisone cautions discussed with patient.  Use Claritin.  See after visit summary.  Update me as needed.  He agrees with plan.  Okay for outpatient follow-up.

## 2021-02-22 ENCOUNTER — Other Ambulatory Visit: Payer: Self-pay

## 2021-02-22 DIAGNOSIS — K219 Gastro-esophageal reflux disease without esophagitis: Secondary | ICD-10-CM

## 2021-02-22 MED ORDER — OMEPRAZOLE 20 MG PO CPDR: 20.0000 mg | DELAYED_RELEASE_CAPSULE | ORAL | 1 refills | Status: DC | PRN

## 2021-08-15 ENCOUNTER — Other Ambulatory Visit: Payer: Self-pay | Admitting: Family Medicine

## 2021-08-15 DIAGNOSIS — K219 Gastro-esophageal reflux disease without esophagitis: Secondary | ICD-10-CM

## 2021-09-21 ENCOUNTER — Encounter: Payer: Self-pay | Admitting: Family Medicine

## 2021-09-21 ENCOUNTER — Ambulatory Visit (INDEPENDENT_AMBULATORY_CARE_PROVIDER_SITE_OTHER): Payer: BC Managed Care – PPO | Admitting: Family Medicine

## 2021-09-21 VITALS — BP 122/80 | HR 75 | Temp 98.0°F | Ht 75.0 in | Wt 352.0 lb

## 2021-09-21 DIAGNOSIS — R7303 Prediabetes: Secondary | ICD-10-CM | POA: Diagnosis not present

## 2021-09-21 DIAGNOSIS — Z0000 Encounter for general adult medical examination without abnormal findings: Secondary | ICD-10-CM | POA: Diagnosis not present

## 2021-09-21 DIAGNOSIS — E782 Mixed hyperlipidemia: Secondary | ICD-10-CM

## 2021-09-21 LAB — COMPREHENSIVE METABOLIC PANEL
ALT: 34 U/L (ref 0–53)
AST: 21 U/L (ref 0–37)
Albumin: 4.8 g/dL (ref 3.5–5.2)
Alkaline Phosphatase: 73 U/L (ref 39–117)
BUN: 16 mg/dL (ref 6–23)
CO2: 29 mEq/L (ref 19–32)
Calcium: 9.8 mg/dL (ref 8.4–10.5)
Chloride: 101 mEq/L (ref 96–112)
Creatinine, Ser: 1.19 mg/dL (ref 0.40–1.50)
GFR: 80.78 mL/min (ref >60.00)
Glucose, Bld: 115 mg/dL — ABNORMAL HIGH (ref 70–99)
Potassium: 4.6 mEq/L (ref 3.5–5.1)
Sodium: 137 mEq/L (ref 135–145)
Total Bilirubin: 0.5 mg/dL (ref 0.2–1.2)
Total Protein: 7.4 g/dL (ref 6.0–8.3)

## 2021-09-21 LAB — LIPID PANEL
Cholesterol: 239 mg/dL — ABNORMAL HIGH (ref 0–200)
HDL: 32.5 mg/dL — ABNORMAL LOW (ref >39.00)
NonHDL: 206.14
Total CHOL/HDL Ratio: 7
Triglycerides: 226 mg/dL — ABNORMAL HIGH (ref 0.0–149.0)
VLDL: 45.2 mg/dL — ABNORMAL HIGH (ref 0.0–40.0)

## 2021-09-21 LAB — LDL CHOLESTEROL, DIRECT: Direct LDL: 177 mg/dL

## 2021-09-21 LAB — HEMOGLOBIN A1C: Hgb A1c MFr Bld: 6 % (ref 4.6–6.5)

## 2021-09-21 NOTE — Patient Instructions (Signed)
Continue to work on healthy diet and exercise ? ?Labs today ? ?Call or mychart ? ?If you want ?- urology referral ?- nutrition referral ?

## 2021-09-21 NOTE — Progress Notes (Signed)
Annual Exam  ? ?Chief Complaint:  ?Chief Complaint  ?Patient presents with  ? Annual Exam  ?  No concerns   ? ? ?History of Present Illness:  ?Christian York is a 31 y.o. presents today for annual examination.   ? ? ?Nutrition/Lifestyle ?Diet: trying to eat better - bringing lunch to work, but eating more at night ?Exercise: steps at work ?He is single partner, contraception - OCP (estrogen/progesterone).  ?Any issues getting or maintaining an erection? no ? ?Social History  ? ?Tobacco Use  ?Smoking Status Former  ? Packs/day: 0.75  ? Years: 5.00  ? Pack years: 3.75  ? Types: Cigarettes  ? Quit date: 06/05/2015  ? Years since quitting: 6.3  ?Smokeless Tobacco Never  ? ?Social History  ? ?Substance and Sexual Activity  ?Alcohol Use Yes  ? Comment: once a month  ? ?Social History  ? ?Substance and Sexual Activity  ?Drug Use No  ? ? ? ?Safety ?The patient wears seatbelts: yes.     ?The patient feels safe at home and in their relationships: yes. ? ?General Health ?Dentist in the last year: Yes ?Eye doctor: not applicable ? ?Weight ?Wt Readings from Last 3 Encounters:  ?09/21/21 (!) 352 lb (159.7 kg)  ?12/19/20 (!) 354 lb (160.6 kg)  ?09/20/20 (!) 350 lb 4 oz (158.9 kg)  ? ?Patient has very high BMI  ?BMI Readings from Last 1 Encounters:  ?09/21/21 44.00 kg/m?  ? ? ? ?Chronic disease screening ?Blood pressure monitoring:  ?BP Readings from Last 3 Encounters:  ?09/21/21 122/80  ?12/19/20 112/80  ?09/20/20 128/78  ? ? ?Lipid Monitoring: Indication for screening: age >35, obesity, diabetes, family hx, CV risk factors.  ?Lipid screening: Yes ? ?Lab Results  ?Component Value Date  ? CHOL 224 (H) 09/20/2020  ? HDL 34.20 (L) 09/20/2020  ? LDLDIRECT 163.0 09/20/2020  ? TRIG 248.0 (H) 09/20/2020  ? CHOLHDL 7 09/20/2020  ? ? ? ?Diabetes Screening: age 70>40, overweight, family hx, PCOS, hx of gestational diabetes, at risk ethnicity, elevated blood pressure >135/80.  ?Diabetes Screening screening: Yes ? ?Lab Results  ?Component Value  Date  ? HGBA1C 5.9 09/20/2020  ? ? ? ?Immunization History  ?Administered Date(s) Administered  ? Influenza,inj,Quad PF,6+ Mos 03/06/2017, 03/13/2018, 03/21/2019  ? Influenza-Unspecified 05/13/2017  ? PFIZER(Purple Top)SARS-COV-2 Vaccination 09/04/2019, 09/30/2019  ? Tdap 09/20/2020  ? ? ?Past Medical History:  ?Diagnosis Date  ? GERD (gastroesophageal reflux disease)   ? Hyperlipidemia   ? ? ?No past surgical history on file. ? ?Prior to Admission medications   ?Medication Sig Start Date End Date Taking? Authorizing Provider  ?omeprazole (PRILOSEC) 20 MG capsule TAKE 1 CAPSULE (20 MG TOTAL) BY MOUTH AS NEEDED. 08/15/21  Yes Lynnda Childody, Milad Bublitz R, MD  ? ? ?Allergies  ?Allergen Reactions  ? Amoxicillin Rash  ? ? ? ?Social History  ? ?Socioeconomic History  ? Marital status: Married  ?  Spouse name: Fredric MareBailey  ? Number of children: 1  ? Years of education: college  ? Highest education level: Not on file  ?Occupational History  ? Not on file  ?Tobacco Use  ? Smoking status: Former  ?  Packs/day: 0.75  ?  Years: 5.00  ?  Pack years: 3.75  ?  Types: Cigarettes  ?  Quit date: 06/05/2015  ?  Years since quitting: 6.3  ? Smokeless tobacco: Never  ?Vaping Use  ? Vaping Use: Former  ?Substance and Sexual Activity  ? Alcohol use: Yes  ?  Comment: once a month  ? Drug use: No  ? Sexual activity: Yes  ?  Comment: wife, pregnant  ?Other Topics Concern  ? Not on file  ?Social History Narrative  ? Patient is married and has 1 son (dob 08/2018).  He manages a Metallurgist.  ?   ? 09/20/20  ? From: the area  ? Living: with wife, Fredric Mare (2017) and children  ? Work: Production designer, theatre/television/film for a Metallurgist  ?   ? Family: Jonah (2020) and one on the way  ?   ? Enjoys: fish, play with jonah, watch sports  ?   ? Exercise: not currently, active job with walking and moderate lifting  ? Diet: tries to eat healthy, more eating out lately  ?   ? Safety  ? Seat belts: Yes   ? Guns: Yes  and secure  ? Safe in relationships: Yes   ? ?Social Determinants of Health   ? ?Financial Resource Strain: Not on file  ?Food Insecurity: Not on file  ?Transportation Needs: Not on file  ?Physical Activity: Not on file  ?Stress: Not on file  ?Social Connections: Not on file  ?Intimate Partner Violence: Not on file  ? ? ?Family History  ?Problem Relation Age of Onset  ? Diabetes Mother   ? Hyperlipidemia Mother   ? Stroke Mother 19  ? Heart attack Mother 78  ? Diabetes Father   ? Breast cancer Maternal Grandmother   ? Pancreatic cancer Maternal Grandfather   ? COPD Paternal Grandmother   ? ? ?Review of Systems  ?Constitutional:  Negative for chills and fever.  ?HENT:  Negative for congestion and sore throat.   ?Eyes:  Negative for blurred vision and double vision.  ?Respiratory:  Negative for shortness of breath.   ?Cardiovascular:  Negative for chest pain.  ?Gastrointestinal:  Negative for heartburn, nausea and vomiting.  ?Genitourinary: Negative.   ?Musculoskeletal: Negative.  Negative for myalgias.  ?Skin:  Negative for rash.  ?Neurological:  Negative for dizziness and headaches.  ?Endo/Heme/Allergies:  Does not bruise/bleed easily.  ?Psychiatric/Behavioral:  Negative for depression. The patient is not nervous/anxious.    ? ?Physical Exam ?BP 122/80   Pulse 75   Temp 98 ?F (36.7 ?C) (Oral)   Ht 6\' 3"  (1.905 m)   Wt (!) 352 lb (159.7 kg)   SpO2 98%   BMI 44.00 kg/m?   ? ?BP Readings from Last 3 Encounters:  ?09/21/21 122/80  ?12/19/20 112/80  ?09/20/20 128/78  ? ? ? ? ?Physical Exam ?Constitutional:   ?   General: He is not in acute distress. ?   Appearance: He is well-developed. He is obese. He is not diaphoretic.  ?HENT:  ?   Head: Normocephalic and atraumatic.  ?   Right Ear: Tympanic membrane and ear canal normal.  ?   Left Ear: Tympanic membrane and ear canal normal.  ?   Nose: Nose normal.  ?   Mouth/Throat:  ?   Pharynx: Uvula midline.  ?Eyes:  ?   General: No scleral icterus. ?   Conjunctiva/sclera: Conjunctivae normal.  ?   Pupils: Pupils are equal, round, and reactive to  light.  ?Cardiovascular:  ?   Rate and Rhythm: Normal rate and regular rhythm.  ?   Heart sounds: Normal heart sounds. No murmur heard. ?Pulmonary:  ?   Effort: Pulmonary effort is normal. No respiratory distress.  ?   Breath sounds: Normal breath sounds. No wheezing.  ?Abdominal:  ?  General: Bowel sounds are normal. There is no distension.  ?   Palpations: Abdomen is soft. There is no mass.  ?   Tenderness: There is no abdominal tenderness. There is no guarding.  ?Musculoskeletal:     ?   General: Normal range of motion.  ?   Cervical back: Normal range of motion and neck supple.  ?Lymphadenopathy:  ?   Cervical: No cervical adenopathy.  ?Skin: ?   General: Skin is warm and dry.  ?   Capillary Refill: Capillary refill takes less than 2 seconds.  ?Neurological:  ?   Mental Status: He is alert and oriented to person, place, and time.  ? ? ? ? ? ?Results: ? 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 ?  ? ? ?  09/21/2021  ?  9:55 AM 09/20/2020  ?  8:46 AM 07/27/2019  ?  8:23 AM  ?Depression screen PHQ 2/9  ?Decreased Interest 1 0 0  ?Down, Depressed, Hopeless 0 0 0  ?PHQ - 2 Score 1 0 0  ?Altered sleeping 0    ?Tired, decreased energy 1    ?Change in appetite 1    ?Feeling bad or failure about yourself  0    ?Trouble concentrating 0    ?Moving slowly or fidgety/restless 0    ?Suicidal thoughts 0    ?PHQ-9 Score 3    ?Difficult doing work/chores Not difficult at all    ? ? ? ? ? ?Assessment: 31 y.o. here for routine annual physical examination. ? ?Plan: ?Problem List Items Addressed This Visit   ? ?  ? Other  ? Morbid obesity (HCC)  ?  Continue healthy diet.  Courage adding exercise.  Discussed medication assistance versus nutrition versus metformin.  Repeat labs today patient will update if he wants to pursue any of these things. ? ?  ?  ? Relevant Orders  ? Comprehensive metabolic panel  ? Prediabetes  ? Relevant Orders  ? Hemoglobin A1c  ?  Hyperlipidemia  ? Relevant Orders  ? Lipid panel  ? ?Other Visit Diagnoses   ? ? Annual physical exam    -  Primary  ? ?  ? ? ?Screening: ?-- Blood pressure screen normal ?-- cholesterol screening: will obtain ?-- Weight scree

## 2021-09-21 NOTE — Assessment & Plan Note (Signed)
Continue healthy diet.  Courage adding exercise.  Discussed medication assistance versus nutrition versus metformin.  Repeat labs today patient will update if he wants to pursue any of these things. ?

## 2021-10-12 ENCOUNTER — Emergency Department (HOSPITAL_COMMUNITY)
Admission: EM | Admit: 2021-10-12 | Discharge: 2021-10-13 | Discharge status: Against Medical Advice | Payer: BC Managed Care – PPO | Attending: Emergency Medicine | Admitting: Emergency Medicine

## 2021-10-12 ENCOUNTER — Other Ambulatory Visit: Payer: Self-pay

## 2021-10-12 DIAGNOSIS — Z5321 Procedure and treatment not carried out due to patient leaving prior to being seen by health care provider: Secondary | ICD-10-CM | POA: Diagnosis not present

## 2021-10-12 DIAGNOSIS — S61012A Laceration without foreign body of left thumb without damage to nail, initial encounter: Secondary | ICD-10-CM | POA: Insufficient documentation

## 2021-10-12 DIAGNOSIS — Y93G1 Activity, food preparation and clean up: Secondary | ICD-10-CM | POA: Insufficient documentation

## 2021-10-12 DIAGNOSIS — W269XXA Contact with unspecified sharp object(s), initial encounter: Secondary | ICD-10-CM | POA: Insufficient documentation

## 2021-10-13 ENCOUNTER — Emergency Department
Admission: RE | Admit: 2021-10-13 | Discharge: 2021-10-13 | Disposition: A | Discharge status: Home / Self Care | Payer: BC Managed Care – PPO | Source: Ambulatory Visit | Attending: Family Medicine | Admitting: Family Medicine

## 2021-10-13 ENCOUNTER — Other Ambulatory Visit: Payer: Self-pay

## 2021-10-13 ENCOUNTER — Encounter (HOSPITAL_COMMUNITY): Payer: Self-pay | Admitting: Emergency Medicine

## 2021-10-13 VITALS — BP 133/89 | HR 74 | Temp 97.7°F | Resp 20 | Ht 75.0 in | Wt 350.0 lb

## 2021-10-13 DIAGNOSIS — S61012A Laceration without foreign body of left thumb without damage to nail, initial encounter: Secondary | ICD-10-CM

## 2021-10-13 NOTE — Discharge Instructions (Signed)
Keep wound clean and dry.  Return for any signs of infection (or follow-up with family doctor):  Increasing redness, swelling, pain, heat, drainage, etc. °Follow instructions on Dermabond information sheet.  °

## 2021-10-13 NOTE — ED Triage Notes (Addendum)
Pt presents to Urgent Care with c/o laceration to L thumb while cutting a watermelon last night. Hemostasis achieved and pt cleansed wound. Covered w/ band aid. Last Tdap was 09/2020.  ?

## 2021-10-13 NOTE — ED Provider Notes (Signed)
?[facility] ? ? ? ?CSN: [id number] ?Arrival date & time: 10/13/21  1016 ? ? ?  ? ?History   ?Chief Complaint ?Chief Complaint  ?Patient presents with  ? Laceration  ? 10:00 appt  ? ? ?HPI ?Christian York is a 31 y.o. male.  ? ?Patient lacerated his left thumb while cutting a watermelon last night.   His last Tdap was 09/2020. ? ?The history is provided by the patient.  ?Laceration ?Location:  Finger ?Finger laceration location:  L thumb ?Length:  1cm ?Depth:  Through underlying tissue ?Quality: straight   ?Bleeding: controlled   ?Time since incident:  1 day ?Laceration mechanism:  Knife ?Pain details:  ?  Quality:  Aching ?  Severity:  Mild ?  Timing:  Constant ?  Progression:  Improving ?Foreign body present:  No foreign bodies ?Ineffective treatments:  None tried ?Tetanus status:  Up to date ?Associated symptoms: no numbness, no redness and no swelling   ? ?Past Medical History:  ?Diagnosis Date  ? GERD (gastroesophageal reflux disease)   ? Hyperlipidemia   ? ? ?Patient Active Problem List  ? Diagnosis Date Noted  ? Poison ivy 12/21/2020  ? Acid reflux 09/20/2020  ? Morbid obesity (HCC) 09/20/2020  ? Prediabetes 09/20/2020  ? Hyperlipidemia 09/20/2020  ? ? ?History reviewed. No pertinent surgical history. ? ? ? ? ?Home Medications   ? ?Prior to Admission medications   ?Medication Sig Start Date End Date Taking? Authorizing Provider  ?omeprazole (PRILOSEC) 20 MG capsule TAKE 1 CAPSULE (20 MG TOTAL) BY MOUTH AS NEEDED. 08/15/21   Lynnda Child, MD  ? ? ?Family History ?Family History  ?Problem Relation Age of Onset  ? Diabetes Mother   ? Hyperlipidemia Mother   ? Stroke Mother 52  ? Heart attack Mother 9  ? Diabetes Father   ? Breast cancer Maternal Grandmother   ? Pancreatic cancer Maternal Grandfather   ? COPD Paternal Grandmother   ? ? ?Social History ?Social History  ? ?Tobacco Use  ? Smoking status: Former  ?  Packs/day: 0.75  ?  Years: 5.00  ?  Pack years: 3.75  ?  Types: Cigarettes  ?  Quit date:  06/05/2015  ?  Years since quitting: 6.3  ? Smokeless tobacco: Never  ?Vaping Use  ? Vaping Use: Former  ?Substance Use Topics  ? Alcohol use: Yes  ?  Comment: rare  ? Drug use: No  ? ? ? ?Allergies   ?Amoxicillin ? ? ?Review of Systems ?Review of Systems  ?All other systems reviewed and are negative. ? ? ?Physical Exam ?Triage Vital Signs ?ED Triage Vitals  ?Enc Vitals Group  ?   BP 10/13/21 1050 133/89  ?   Pulse Rate 10/13/21 1050 74  ?   Resp 10/13/21 1050 20  ?   Temp 10/13/21 1050 97.7 ?F (36.5 ?C)  ?   Temp Source 10/13/21 1050 Oral  ?   SpO2 10/13/21 1050 98 %  ?   Weight 10/13/21 1047 (!) 350 lb (158.8 kg)  ?   Height 10/13/21 1047 6\' 3"  (1.905 m)  ?   Head Circumference --   ?   Peak Flow --   ?   Pain Score 10/13/21 1046 0  ?   Pain Loc --   ?   Pain Edu? --   ?   Excl. in GC? --   ? ?No data found. ? ?Updated Vital Signs ?BP 133/89 (BP Location: Left Arm)  Pulse 74   Temp 97.7 ?F (36.5 ?C) (Oral)   Resp 20   Ht 6\' 3"  (1.905 m)   Wt (!) 158.8 kg   SpO2 98%   BMI 43.75 kg/m?  ? ?Visual Acuity ?Right Eye Distance:   ?Left Eye Distance:   ?Bilateral Distance:   ? ?Right Eye Near:   ?Left Eye Near:    ?Bilateral Near:    ? ?Physical Exam ?Vitals and nursing note reviewed.  ?Constitutional:   ?   General: He is not in acute distress. ?HENT:  ?   Head: Atraumatic.  ?Eyes:  ?   Pupils: Pupils are equal, round, and reactive to light.  ?Cardiovascular:  ?   Rate and Rhythm: Normal rate.  ?Pulmonary:  ?   Effort: Pulmonary effort is normal.  ?Musculoskeletal:  ?     Hands: ? ?   Comments: Left thumb tip has a 1cm long laceration on volar surface as noted on diagram.  Distal sensation and cap refill intact. ?   ?Skin: ?   General: Skin is warm and dry.  ?Neurological:  ?   Mental Status: He is alert.  ? ? ? ?UC Treatments / Results  ?Labs ?(all labs ordered are listed, but only abnormal results are displayed) ?Labs Reviewed - No data to display ? ?EKG ? ? ?Radiology ?No results found. ? ?Procedures ?Procedures   Laceration Repair (Dermabond) ?Discussed benefits and risks of procedure and verbal consent obtained. ?Using sterile technique, cleansed wound with Betadine followed by lavage with normal saline.  Wound carefully inspected for debris and foreign bodies; none found.  Wound edges carefully approximated in normal anatomic position using 59M SteriStrips, then closed with Dermabond.  Wound precautions explained to patient.  Stack splint applied. ? ? ?Medications Ordered in UC ?Medications - No data to display ? ?Initial Impression / Assessment and Plan / UC Course  ?I have reviewed the triage vital signs and the nursing notes. ? ?Pertinent labs & imaging results that were available during my care of the patient were reviewed by me and considered in my medical decision making (see chart for details). ? ?  ? ? ?Final Clinical Impressions(s) / UC Diagnoses  ? ?Final diagnoses:  ?Laceration of skin of left thumb, initial encounter  ? ? ? ?Discharge Instructions   ? ?  ?Keep wound clean and dry.  Return for any signs of infection (or follow-up with family doctor):  Increasing redness, swelling, pain, heat, drainage, etc. ?Follow instructions on Dermabond information sheet. ? ? ? ? ?ED Prescriptions   ?None ?  ? ? ?  ? , MD ?10/15/21 1925 ? ?

## 2021-10-13 NOTE — ED Triage Notes (Signed)
Presents from home for L thumb lac that occurred while chopping watermelon. Bleeding controlled, no obvious nail bed involvement.  ?Tetanus UTD (2022) ?

## 2021-11-10 ENCOUNTER — Other Ambulatory Visit: Payer: Self-pay | Admitting: Family Medicine

## 2021-11-10 DIAGNOSIS — K219 Gastro-esophageal reflux disease without esophagitis: Secondary | ICD-10-CM

## 2022-05-29 ENCOUNTER — Telehealth: Payer: Self-pay

## 2022-05-29 ENCOUNTER — Other Ambulatory Visit: Payer: Self-pay

## 2022-05-29 DIAGNOSIS — K219 Gastro-esophageal reflux disease without esophagitis: Secondary | ICD-10-CM

## 2022-05-29 MED ORDER — OMEPRAZOLE 20 MG PO CPDR: 20.0000 mg | DELAYED_RELEASE_CAPSULE | ORAL | 1 refills | Status: DC | PRN

## 2022-05-29 NOTE — Telephone Encounter (Signed)
Received refill request for omeprazole.  Please call patient to set up [facility] appointment.  Let me know once he is set up so I can refill his omeprazole. Thanks.

## 2022-05-29 NOTE — Telephone Encounter (Signed)
Patient has a TOC scheduled.  

## 2022-05-29 NOTE — Telephone Encounter (Signed)
Refill for omeprazole has been provided to get to [facility] appointment.

## 2022-09-24 ENCOUNTER — Encounter: Payer: Self-pay | Admitting: Nurse Practitioner

## 2022-09-24 ENCOUNTER — Ambulatory Visit: Payer: BC Managed Care – PPO | Admitting: Nurse Practitioner

## 2022-09-24 VITALS — BP 122/76 | HR 59 | Temp 97.4°F | Ht 75.0 in | Wt 323.0 lb

## 2022-09-24 DIAGNOSIS — Z0000 Encounter for general adult medical examination without abnormal findings: Secondary | ICD-10-CM

## 2022-09-24 DIAGNOSIS — K219 Gastro-esophageal reflux disease without esophagitis: Secondary | ICD-10-CM

## 2022-09-24 DIAGNOSIS — E785 Hyperlipidemia, unspecified: Secondary | ICD-10-CM

## 2022-09-24 DIAGNOSIS — R7303 Prediabetes: Secondary | ICD-10-CM | POA: Diagnosis not present

## 2022-09-24 LAB — COMPREHENSIVE METABOLIC PANEL
ALT: 31 U/L (ref 0–53)
AST: 20 U/L (ref 0–37)
Albumin: 4.6 g/dL (ref 3.5–5.2)
Alkaline Phosphatase: 65 U/L (ref 39–117)
BUN: 14 mg/dL (ref 6–23)
CO2: 28 mEq/L (ref 19–32)
Calcium: 9.6 mg/dL (ref 8.4–10.5)
Chloride: 103 mEq/L (ref 96–112)
Creatinine, Ser: 1.21 mg/dL (ref 0.40–1.50)
GFR: 78.63 mL/min (ref >60.00)
Glucose, Bld: 104 mg/dL — ABNORMAL HIGH (ref 70–99)
Potassium: 4.5 mEq/L (ref 3.5–5.1)
Sodium: 138 mEq/L (ref 135–145)
Total Bilirubin: 0.4 mg/dL (ref 0.2–1.2)
Total Protein: 6.9 g/dL (ref 6.0–8.3)

## 2022-09-24 LAB — CBC
HCT: 43.6 % (ref 39.0–52.0)
Hemoglobin: 14.8 g/dL (ref 13.0–17.0)
MCHC: 33.9 g/dL (ref 30.0–36.0)
MCV: 89.6 fl (ref 78.0–100.0)
Platelets: 259 10*3/uL (ref 150.0–400.0)
RBC: 4.86 Mil/uL (ref 4.22–5.81)
RDW: 13.8 % (ref 11.5–15.5)
WBC: 6.6 10*3/uL (ref 4.0–10.5)

## 2022-09-24 LAB — HEMOGLOBIN A1C: Hgb A1c MFr Bld: 5.7 % (ref 4.6–6.5)

## 2022-09-24 LAB — LIPID PANEL
Cholesterol: 176 mg/dL (ref 0–200)
HDL: 28.6 mg/dL — ABNORMAL LOW (ref >39.00)
LDL Cholesterol: 119 mg/dL — ABNORMAL HIGH (ref 0–99)
NonHDL: 146.93
Total CHOL/HDL Ratio: 6
Triglycerides: 140 mg/dL (ref 0.0–149.0)
VLDL: 28 mg/dL (ref 0.0–40.0)

## 2022-09-24 LAB — TSH: TSH: 1.96 u[IU]/mL (ref 0.35–5.50)

## 2022-09-24 NOTE — Assessment & Plan Note (Signed)
Patient does know foods to avoid which trigger his reflux.  He does use omeprazole every other day to every third day.  He has been working on lifestyle modifications and working on losing weight.  Continue all the above continue omeprazole as scheduled

## 2022-09-24 NOTE — Progress Notes (Signed)
Established Patient Office Visit  Subjective   Patient ID: Christian York, male    DOB: 1991-04-26  Age: 32 y.o. MRN: [id number]  Chief Complaint  Patient presents with   Transitions Of Care    Fasting  Dr. Selena Batten consisted putting him on metformin due to his A1c, would like to discuss this.    HPI  Transfer of care: Last seen by Gweneth Dimitri, MD 09/21/2021 Last CPE 09/21/2021  GERD: states that he will take omperzole every other to every 3rd day. States that if he does it prn it does ont work well. States that he does know trigger foods   for complete physical and follow up of chronic conditions.  Immunizations: -Tetanus: Completed in 2022 -Influenza: refused/end of the season -Shingles: too young -Pneumonia: too young  Diet: Fair diet. 2 meals a day, skips breakfast. States that he will do small lunch and larger dinner. States not a Event organiser. Limiting carbs. States that he will doe a sugar free monster and water. Sometimes soda  Exercise:  States that he will do a Lobbyist workout at home. States that he has been trying to walk more   Eye exam: As needed Dental exam: Completes semi-annually    Colonoscopy: Completed in 2007 for an anal fissure.  Currently too young for screening colonoscopy currently average risk Lung Cancer Screening: Too young  PSA: Too young, currently average risk  Sleep: states that he goes to bed around 10 and gets up around 6-630 and feels rested. Does snore       Review of Systems  Constitutional:  Negative for chills and fever.  Respiratory:  Negative for shortness of breath.   Cardiovascular:  Negative for chest pain and leg swelling.  Gastrointestinal:  Negative for abdominal pain, blood in stool, constipation, diarrhea, nausea and vomiting.       BM daily   Genitourinary:  Negative for dysuria and hematuria.  Neurological:  Negative for tingling and headaches.  Psychiatric/Behavioral:  Negative for hallucinations and suicidal  ideas.       Objective:     BP 122/76   Pulse (!) 59   Temp (!) 97.4 F (36.3 C) (Temporal)   Ht  (1.905 m)   Wt (!) 323 lb (146.5 kg)   SpO2 98%   BMI 40.37 kg/m  BP Readings from Last 3 Encounters:  09/24/22 122/76  10/13/21 133/89  10/13/21 136/90   Wt Readings from Last 3 Encounters:  09/24/22 (!) 323 lb (146.5 kg)  10/13/21 (!) 350 lb (158.8 kg)  10/13/21 (!) 350 lb (158.8 kg)      Physical Exam Vitals and nursing note reviewed. Exam conducted with a chaperone present Kerry Dory, CMA).  Constitutional:      Appearance: Normal appearance.  HENT:     Right Ear: Tympanic membrane, ear canal and external ear normal.     Left Ear: Tympanic membrane, ear canal and external ear normal.     Mouth/Throat:     Mouth: Mucous membranes are moist.     Pharynx: Oropharynx is clear.  Eyes:     Extraocular Movements: Extraocular movements intact.     Pupils: Pupils are equal, round, and reactive to light.  Cardiovascular:     Rate and Rhythm: Normal rate and regular rhythm.     Pulses: Normal pulses.     Heart sounds: Normal heart sounds.  Pulmonary:     Effort: Pulmonary effort is normal.     Breath sounds:  Normal breath sounds.  Abdominal:     General: Bowel sounds are normal. There is no distension.     Palpations: There is no mass.     Tenderness: There is no abdominal tenderness.     Hernia: No hernia is present. There is no hernia in the left inguinal area or right inguinal area.  Genitourinary:    Penis: Normal.      Testes: Normal.     Epididymis:     Right: Normal.     Left: Normal.  Musculoskeletal:     Right lower leg: No edema.     Left lower leg: No edema.  Lymphadenopathy:     Cervical: No cervical adenopathy.     Lower Body: No right inguinal adenopathy. No left inguinal adenopathy.  Skin:    General: Skin is warm.  Neurological:     General: No focal deficit present.     Mental Status: He is alert.     Deep Tendon Reflexes:      Reflex Scores:      Bicep reflexes are 2+ on the right side and 2+ on the left side.      Patellar reflexes are 2+ on the right side and 2+ on the left side.    Comments: Bilateral upper and lower extremity strength 5/5  Psychiatric:        Mood and Affect: Mood normal.        Behavior: Behavior normal.        Thought Content: Thought content normal.        Judgment: Judgment normal.      No results found for any visits on 09/24/22.    The ASCVD Risk score (Arnett DK, et al., 2019) failed to calculate for the following reasons:   The 2019 ASCVD risk score is only valid for ages 48 to 20    Assessment & Plan:   Problem List Items Addressed This Visit       Digestive   Acid reflux    Patient does know foods to avoid which trigger his reflux.  He does use omeprazole every other day to every third day.  He has been working on lifestyle modifications and working on losing weight.  Continue all the above continue omeprazole as scheduled        Other   Morbid obesity    Patient has lost approximate 25 pounds since last office visit.  Continue working lifestyle modifications inclusive of diet and exercise      Relevant Orders   Hemoglobin A1c   TSH   Lipid panel   Prediabetes - Primary    Continue working on lifestyle modifications pending A1c today      Relevant Orders   Hemoglobin A1c   Hyperlipidemia    Patient is been working on lifestyle modifications losing weight continue pending lipid panel today      Relevant Orders   Lipid panel   Preventative health care    Discussed age-appropriate immunizations and screening exams.  Patient is up-to-date on all vaccines.  Did review patient's personal, surgical, social, family histories.  Patient is too young for CRC screening or prostate cancer screening.  Patient was given information at discharge about preventative healthcare maintenance with anticipatory guidance for his age range.      Relevant Orders   CBC    Comprehensive metabolic panel   TSH    Return in about 1 year (around 09/24/2023) for CPE and Labs.    Audria Nine,  NP

## 2022-09-24 NOTE — Assessment & Plan Note (Signed)
Discussed age-appropriate immunizations and screening exams.  Patient is up-to-date on all vaccines.  Did review patient's personal, surgical, social, family histories.  Patient is too young for CRC screening or prostate cancer screening.  Patient was given information at discharge about preventative healthcare maintenance with anticipatory guidance for his age range.

## 2022-09-24 NOTE — Assessment & Plan Note (Signed)
Patient has lost approximate 25 pounds since last office visit.  Continue working lifestyle modifications inclusive of diet and exercise

## 2022-09-24 NOTE — Assessment & Plan Note (Signed)
Patient is been working on lifestyle modifications losing weight continue pending lipid panel today

## 2022-09-24 NOTE — Assessment & Plan Note (Signed)
Continue working on lifestyle modifications pending A1c today

## 2022-09-24 NOTE — Patient Instructions (Signed)
Nice to see you today You last tetanus vaccine was in 2022 Continue working on your lifestyle modifications (exercise and diet) I will be in touch with the labs once I have the results Follow up with me in 1 year for your next physical and labs, sooner if you need me

## 2022-09-27 ENCOUNTER — Encounter: Payer: BC Managed Care – PPO | Admitting: Family Medicine

## 2023-09-25 ENCOUNTER — Ambulatory Visit (INDEPENDENT_AMBULATORY_CARE_PROVIDER_SITE_OTHER): Payer: BC Managed Care – PPO | Admitting: Nurse Practitioner

## 2023-09-25 ENCOUNTER — Encounter: Payer: Self-pay | Admitting: Nurse Practitioner

## 2023-09-25 VITALS — BP 112/80 | HR 65 | Temp 97.7°F | Ht 74.5 in | Wt 299.2 lb

## 2023-09-25 DIAGNOSIS — K219 Gastro-esophageal reflux disease without esophagitis: Secondary | ICD-10-CM | POA: Diagnosis not present

## 2023-09-25 DIAGNOSIS — Z87891 Personal history of nicotine dependence: Secondary | ICD-10-CM | POA: Insufficient documentation

## 2023-09-25 DIAGNOSIS — R7303 Prediabetes: Secondary | ICD-10-CM | POA: Diagnosis not present

## 2023-09-25 DIAGNOSIS — E785 Hyperlipidemia, unspecified: Secondary | ICD-10-CM

## 2023-09-25 DIAGNOSIS — E669 Obesity, unspecified: Secondary | ICD-10-CM | POA: Diagnosis not present

## 2023-09-25 DIAGNOSIS — Z0000 Encounter for general adult medical examination without abnormal findings: Secondary | ICD-10-CM

## 2023-09-25 LAB — COMPREHENSIVE METABOLIC PANEL WITH GFR
ALT: 19 U/L (ref 0–53)
AST: 15 U/L (ref 0–37)
Albumin: 4.6 g/dL (ref 3.5–5.2)
Alkaline Phosphatase: 64 U/L (ref 39–117)
BUN: 21 mg/dL (ref 6–23)
CO2: 30 meq/L (ref 19–32)
Calcium: 9.6 mg/dL (ref 8.4–10.5)
Chloride: 103 meq/L (ref 96–112)
Creatinine, Ser: 1.17 mg/dL (ref 0.40–1.50)
GFR: 81.29 mL/min (ref >60.00)
Glucose, Bld: 104 mg/dL — ABNORMAL HIGH (ref 70–99)
Potassium: 4.4 meq/L (ref 3.5–5.1)
Sodium: 138 meq/L (ref 135–145)
Total Bilirubin: 0.5 mg/dL (ref 0.2–1.2)
Total Protein: 6.9 g/dL (ref 6.0–8.3)

## 2023-09-25 LAB — CBC
HCT: 45.3 % (ref 39.0–52.0)
Hemoglobin: 15.5 g/dL (ref 13.0–17.0)
MCHC: 34.1 g/dL (ref 30.0–36.0)
MCV: 91.1 fl (ref 78.0–100.0)
Platelets: 255 10*3/uL (ref 150.0–400.0)
RBC: 4.97 Mil/uL (ref 4.22–5.81)
RDW: 13.5 % (ref 11.5–15.5)
WBC: 7 10*3/uL (ref 4.0–10.5)

## 2023-09-25 LAB — URINALYSIS, MICROSCOPIC ONLY
RBC / HPF: NONE SEEN (ref 0–?)
WBC, UA: NONE SEEN (ref 0–?)

## 2023-09-25 LAB — LIPID PANEL
Cholesterol: 186 mg/dL (ref 0–200)
HDL: 34.8 mg/dL — ABNORMAL LOW (ref >39.00)
LDL Cholesterol: 130 mg/dL — ABNORMAL HIGH (ref 0–99)
NonHDL: 151.15
Total CHOL/HDL Ratio: 5
Triglycerides: 104 mg/dL (ref 0.0–149.0)
VLDL: 20.8 mg/dL (ref 0.0–40.0)

## 2023-09-25 LAB — HEMOGLOBIN A1C: Hgb A1c MFr Bld: 5.7 % (ref 4.6–6.5)

## 2023-09-25 LAB — TSH: TSH: 1.95 u[IU]/mL (ref 0.35–5.50)

## 2023-09-25 NOTE — Progress Notes (Signed)
 Established Patient Office Visit  Subjective   Patient ID: Christian York, male    DOB: 11/03/1990  Age: 33 y.o. MRN: [id number]  Chief Complaint  Patient presents with   Annual Exam    HPI  GERD: he will use omeprazole  20 mg prn. He does not need it as much that he is losing weight.  Patient does not have to change his diet in regards to heartburn modification  for complete physical and follow up of chronic conditions.  Immunizations: -Tetanus: Completed in 2022 -Influenza: out of season  -Shingles: too young  -Pneumonia: too young    Diet: Fair diet. He is eating 1-2 meals a day. States that he has been doing one meal a day. He does not snack. He drinks water and energy drinks. Zero ultra monsters Exercise: No regular exercise.  He does have a kettle bell at home that he does intermittent  Eye exam: Completes annually  Dental exam: Completes semi-annually    Colonoscopy: Too young, currently average risk Lung Cancer Screening: N/A  PSA: Too young, currently average risk  Sleep: he will go to bed around 10-11 and get up 6-630. Feels rested and does snore.  He sleeps on his stomach       Review of Systems  Constitutional:  Negative for chills and fever.  Respiratory:  Negative for shortness of breath.   Cardiovascular:  Negative for chest pain and leg swelling.  Gastrointestinal:  Negative for abdominal pain, blood in stool, constipation, diarrhea, nausea and vomiting.       BM daily   Genitourinary:  Negative for dysuria and hematuria.  Neurological:  Negative for tingling and headaches.  Psychiatric/Behavioral:  Negative for hallucinations and suicidal ideas.       Objective:     BP 112/80   Pulse 65   Temp 97.7 F (36.5 C) (Oral)   Ht 6' 2.5" (1.892 m)   Wt 299 lb 3.2 oz (135.7 kg)   SpO2 99%   BMI 37.90 kg/m  BP Readings from Last 3 Encounters:  09/25/23 112/80  09/24/22 122/76  10/13/21 133/89   Wt Readings from Last 3 Encounters:   09/25/23 299 lb 3.2 oz (135.7 kg)  09/24/22 (!) 323 lb (146.5 kg)  10/13/21 (!) 350 lb (158.8 kg)   SpO2 Readings from Last 3 Encounters:  09/25/23 99%  09/24/22 98%  10/13/21 98%      Physical Exam Vitals and nursing note reviewed.  Constitutional:      Appearance: Normal appearance.  HENT:     Right Ear: Tympanic membrane, ear canal and external ear normal.     Left Ear: Tympanic membrane, ear canal and external ear normal.     Mouth/Throat:     Mouth: Mucous membranes are moist.     Pharynx: Oropharynx is clear.  Eyes:     Extraocular Movements: Extraocular movements intact.     Pupils: Pupils are equal, round, and reactive to light.  Cardiovascular:     Rate and Rhythm: Normal rate and regular rhythm.     Pulses: Normal pulses.     Heart sounds: Normal heart sounds.  Pulmonary:     Effort: Pulmonary effort is normal.     Breath sounds: Normal breath sounds.  Abdominal:     General: Bowel sounds are normal. There is no distension.     Palpations: There is no mass.     Tenderness: There is no abdominal tenderness.     Hernia: No hernia is  present.  Genitourinary:    Comments: deferred Musculoskeletal:     Right lower leg: No edema.     Left lower leg: No edema.  Lymphadenopathy:     Cervical: No cervical adenopathy.  Skin:    General: Skin is warm.  Neurological:     General: No focal deficit present.     Mental Status: He is alert.     Deep Tendon Reflexes:     Reflex Scores:      Bicep reflexes are 2+ on the right side and 2+ on the left side.      Patellar reflexes are 2+ on the right side and 2+ on the left side.    Comments: Bilateral upper and lower extremity strength 5/5  Psychiatric:        Mood and Affect: Mood normal.        Behavior: Behavior normal.        Thought Content: Thought content normal.        Judgment: Judgment normal.      Results for orders placed or performed in visit on 09/25/23  CBC  Result Value Ref Range   WBC 7.0 4.0  - 10.5 K/uL   RBC 4.97 4.22 - 5.81 Mil/uL   Platelets 255.0 150.0 - 400.0 K/uL   Hemoglobin 15.5 13.0 - 17.0 g/dL   HCT 98.1 19.1 - 47.8 %   MCV 91.1 78.0 - 100.0 fl   MCHC 34.1 30.0 - 36.0 g/dL   RDW 29.5 62.1 - 30.8 %  Comprehensive metabolic panel with GFR  Result Value Ref Range   Sodium 138 135 - 145 mEq/L   Potassium 4.4 3.5 - 5.1 mEq/L   Chloride 103 96 - 112 mEq/L   CO2 30 19 - 32 mEq/L   Glucose, Bld 104 (H) 70 - 99 mg/dL   BUN 21 6 - 23 mg/dL   Creatinine, Ser 6.57 0.40 - 1.50 mg/dL   Total Bilirubin 0.5 0.2 - 1.2 mg/dL   Alkaline Phosphatase 64 39 - 117 U/L   AST 15 0 - 37 U/L   ALT 19 0 - 53 U/L   Total Protein 6.9 6.0 - 8.3 g/dL   Albumin 4.6 3.5 - 5.2 g/dL   GFR 84.69 >62.95 mL/min   Calcium 9.6 8.4 - 10.5 mg/dL  Hemoglobin M8U  Result Value Ref Range   Hgb A1c MFr Bld 5.7 4.6 - 6.5 %  TSH  Result Value Ref Range   TSH 1.95 0.35 - 5.50 uIU/mL  Lipid panel  Result Value Ref Range   Cholesterol 186 0 - 200 mg/dL   Triglycerides 132.4 0.0 - 149.0 mg/dL   HDL 40.10 (L) >27.25 mg/dL   VLDL 36.6 0.0 - 44.0 mg/dL   LDL Cholesterol 347 (H) 0 - 99 mg/dL   Total CHOL/HDL Ratio 5    NonHDL 151.15   Urine Microscopic  Result Value Ref Range   WBC, UA none seen 0-2/hpf   RBC / HPF none seen 0-2/hpf      The ASCVD Risk score (Arnett DK, et al., 2019) failed to calculate for the following reasons:   The 2019 ASCVD risk score is only valid for ages 35 to 64    Assessment & Plan:   Problem List Items Addressed This Visit       Digestive   Acid reflux   Patient is omeprazole  20 mg as needed.  He has lost weight which seems to help with his reflux.  Continue work on  healthy lifestyle modifications continue medication as needed        Other   Prediabetes   History of the same pending A1c today.  Patient has been continue losing weight continue work on healthy lifestyle modifications      Relevant Orders   CBC (Completed)   Comprehensive metabolic panel  with GFR (Completed)   Hemoglobin A1c (Completed)   TSH (Completed)   Lipid panel (Completed)   Hyperlipidemia   History of the same pending lipid panel      Relevant Orders   Hemoglobin A1c (Completed)   TSH (Completed)   Lipid panel (Completed)   Preventative health care - Primary   Discussed age-appropriate immunizations and screenings.  Did review patient's personal, surgical, social, family histories.  Patient is up-to-date on all age-appropriate vaccinations he would like.  Patient is too young for CRC screening or prostate cancer screening.  Patient was given information at discharge about preventative healthcare maintenance with anticipatory guidance.      Relevant Orders   CBC (Completed)   Comprehensive metabolic panel with GFR (Completed)   TSH (Completed)   Obesity (BMI 30-39.9)   Pending TSH, A1c, lipid panel.  Patient is working on healthy lifestyle modifications and continues to lose weight.  Continue      Relevant Orders   TSH (Completed)   Former smoker   Former smoker pending urine microscopy rule out microscopic hematuria.      Relevant Orders   CBC (Completed)   Comprehensive metabolic panel with GFR (Completed)   Urine Microscopic (Completed)    Return in about 1 year (around 09/24/2024) for CPE and Labs.    Margarie Shay, NP

## 2023-09-25 NOTE — Assessment & Plan Note (Signed)
 Former smoker pending urine microscopy rule out microscopic hematuria.

## 2023-09-25 NOTE — Assessment & Plan Note (Signed)
 Discussed age-appropriate immunizations and screenings.  Did review patient's personal, surgical, social, family histories.  Patient is up-to-date on all age-appropriate vaccinations he would like.  Patient is too young for CRC screening or prostate cancer screening.  Patient was given information at discharge about preventative healthcare maintenance with anticipatory guidance.

## 2023-09-25 NOTE — Patient Instructions (Signed)
Nice to see you today I will be in touch with the labs once I have them Follow up with me in 1 year, sooner If you need me  

## 2023-09-25 NOTE — Assessment & Plan Note (Signed)
 Pending TSH, A1c, lipid panel.  Patient is working on healthy lifestyle modifications and continues to lose weight.  Continue

## 2023-09-25 NOTE — Assessment & Plan Note (Addendum)
History of the same pending lipid panel

## 2023-09-25 NOTE — Assessment & Plan Note (Signed)
 History of the same pending A1c today.  Patient has been continue losing weight continue work on healthy lifestyle modifications

## 2023-09-25 NOTE — Assessment & Plan Note (Signed)
 Patient is omeprazole  20 mg as needed.  He has lost weight which seems to help with his reflux.  Continue work on healthy lifestyle modifications continue medication as needed

## 2023-09-26 ENCOUNTER — Encounter: Payer: Self-pay | Admitting: Nurse Practitioner

## 2024-02-13 ENCOUNTER — Other Ambulatory Visit: Payer: Self-pay | Admitting: Nurse Practitioner

## 2024-02-13 DIAGNOSIS — K219 Gastro-esophageal reflux disease without esophagitis: Secondary | ICD-10-CM

## 2024-02-13 MED ORDER — OMEPRAZOLE 20 MG PO CPDR: 20.0000 mg | DELAYED_RELEASE_CAPSULE | ORAL | 1 refills | Status: AC | PRN

## 2024-02-13 NOTE — Telephone Encounter (Signed)
 Copied from CRM [id number]. Topic: Clinical - Medication Refill >> Feb 13, 2024 12:08 PM Jasmin G wrote: Medication: omeprazole  (PRILOSEC) 20 MG capsule  Has the patient contacted their pharmacy? Yes (Agent: If no, request that the patient contact the pharmacy for the refill. If patient does not wish to contact the pharmacy document the reason why and proceed with request.) (Agent: If yes, when and what did the pharmacy advise?)  This is the patient's preferred pharmacy:  [id number] [place] - [address] [place] [address] Phone: [phone] Fax: [phone]  Is this the correct pharmacy for this prescription? Yes If no, delete pharmacy and type the correct one.   Has the prescription been filled recently? No  Is the patient out of the medication? Yes  Has the patient been seen for an appointment in the last year OR does the patient have an upcoming appointment? Yes  Can we respond through MyChart? No. Call pt back if needed at [phone], pt's wife stated that they can get the med over the counter but is much cheaper to get it prescribed and requested a call back if appt is needed or if any issues arise.  Agent: Please be advised that Rx refills may take up to 3 business days. We ask that you follow-up with your pharmacy.

## 2024-03-29 ENCOUNTER — Ambulatory Visit

## 2024-09-28 ENCOUNTER — Encounter: Admitting: Nurse Practitioner
